# Patient Record
Sex: Male | Born: 1985 | Race: White | Hispanic: No | Marital: Married | Smoking: Former smoker
Health system: Southern US, Community
[De-identification: ages and names within clinical notes are randomized; demographics above are authoritative.]

## PROBLEM LIST (undated history)

## (undated) DIAGNOSIS — L409 Psoriasis, unspecified: Secondary | ICD-10-CM

## (undated) DIAGNOSIS — G4730 Sleep apnea, unspecified: Secondary | ICD-10-CM

## (undated) DIAGNOSIS — M51369 Other intervertebral disc degeneration, lumbar region without mention of lumbar back pain or lower extremity pain: Secondary | ICD-10-CM

## (undated) DIAGNOSIS — M5136 Other intervertebral disc degeneration, lumbar region: Secondary | ICD-10-CM

## (undated) DIAGNOSIS — M5126 Other intervertebral disc displacement, lumbar region: Secondary | ICD-10-CM

## (undated) DIAGNOSIS — F419 Anxiety disorder, unspecified: Secondary | ICD-10-CM

## (undated) DIAGNOSIS — F329 Major depressive disorder, single episode, unspecified: Secondary | ICD-10-CM

## (undated) DIAGNOSIS — F32A Depression, unspecified: Secondary | ICD-10-CM

## (undated) DIAGNOSIS — M1990 Unspecified osteoarthritis, unspecified site: Secondary | ICD-10-CM

## (undated) DIAGNOSIS — K219 Gastro-esophageal reflux disease without esophagitis: Secondary | ICD-10-CM

## (undated) HISTORY — PX: VASECTOMY: SHX75

---

## 2012-09-24 HISTORY — PX: HAND SURGERY: SHX662

## 2015-02-15 ENCOUNTER — Other Ambulatory Visit: Payer: Self-pay | Admitting: Orthopaedic Surgery

## 2015-02-15 DIAGNOSIS — M79644 Pain in right finger(s): Secondary | ICD-10-CM

## 2015-02-26 ENCOUNTER — Ambulatory Visit
Admission: RE | Admit: 2015-02-26 | Discharge: 2015-02-26 | Disposition: A | Discharge status: Home / Self Care | Payer: Non-veteran care | Source: Ambulatory Visit | Attending: Orthopaedic Surgery | Admitting: Orthopaedic Surgery

## 2015-02-26 DIAGNOSIS — M79644 Pain in right finger(s): Secondary | ICD-10-CM

## 2015-07-18 ENCOUNTER — Other Ambulatory Visit: Payer: Self-pay | Admitting: Orthopedic Surgery

## 2015-07-20 ENCOUNTER — Encounter (HOSPITAL_BASED_OUTPATIENT_CLINIC_OR_DEPARTMENT_OTHER): Payer: Self-pay | Admitting: *Deleted

## 2015-07-28 ENCOUNTER — Ambulatory Visit (HOSPITAL_BASED_OUTPATIENT_CLINIC_OR_DEPARTMENT_OTHER)
Admission: RE | Admit: 2015-07-28 | Discharge: 2015-07-28 | Disposition: A | Discharge status: Home / Self Care | Payer: No Typology Code available for payment source | Source: Ambulatory Visit | Attending: Orthopedic Surgery | Admitting: Orthopedic Surgery

## 2015-07-28 ENCOUNTER — Ambulatory Visit (HOSPITAL_BASED_OUTPATIENT_CLINIC_OR_DEPARTMENT_OTHER): Payer: No Typology Code available for payment source | Admitting: Anesthesiology

## 2015-07-28 ENCOUNTER — Encounter (HOSPITAL_BASED_OUTPATIENT_CLINIC_OR_DEPARTMENT_OTHER): Payer: Self-pay | Admitting: Orthopedic Surgery

## 2015-07-28 ENCOUNTER — Encounter (HOSPITAL_BASED_OUTPATIENT_CLINIC_OR_DEPARTMENT_OTHER)
Admission: RE | Disposition: A | Discharge status: Home / Self Care | Payer: Self-pay | Source: Ambulatory Visit | Attending: Orthopedic Surgery

## 2015-07-28 DIAGNOSIS — Z9852 Vasectomy status: Secondary | ICD-10-CM | POA: Diagnosis not present

## 2015-07-28 DIAGNOSIS — Y929 Unspecified place or not applicable: Secondary | ICD-10-CM | POA: Insufficient documentation

## 2015-07-28 DIAGNOSIS — F17220 Nicotine dependence, chewing tobacco, uncomplicated: Secondary | ICD-10-CM | POA: Diagnosis not present

## 2015-07-28 DIAGNOSIS — X58XXXA Exposure to other specified factors, initial encounter: Secondary | ICD-10-CM | POA: Diagnosis not present

## 2015-07-28 DIAGNOSIS — Y999 Unspecified external cause status: Secondary | ICD-10-CM | POA: Insufficient documentation

## 2015-07-28 DIAGNOSIS — G4730 Sleep apnea, unspecified: Secondary | ICD-10-CM | POA: Insufficient documentation

## 2015-07-28 DIAGNOSIS — L409 Psoriasis, unspecified: Secondary | ICD-10-CM | POA: Insufficient documentation

## 2015-07-28 DIAGNOSIS — Y939 Activity, unspecified: Secondary | ICD-10-CM | POA: Insufficient documentation

## 2015-07-28 DIAGNOSIS — Z880 Allergy status to penicillin: Secondary | ICD-10-CM | POA: Diagnosis not present

## 2015-07-28 DIAGNOSIS — M1990 Unspecified osteoarthritis, unspecified site: Secondary | ICD-10-CM | POA: Insufficient documentation

## 2015-07-28 DIAGNOSIS — M65841 Other synovitis and tenosynovitis, right hand: Secondary | ICD-10-CM | POA: Diagnosis not present

## 2015-07-28 DIAGNOSIS — K219 Gastro-esophageal reflux disease without esophagitis: Secondary | ICD-10-CM | POA: Diagnosis not present

## 2015-07-28 DIAGNOSIS — F329 Major depressive disorder, single episode, unspecified: Secondary | ICD-10-CM | POA: Insufficient documentation

## 2015-07-28 DIAGNOSIS — S5321XA Traumatic rupture of right radial collateral ligament, initial encounter: Secondary | ICD-10-CM | POA: Insufficient documentation

## 2015-07-28 DIAGNOSIS — S63418A Traumatic rupture of collateral ligament of other finger at metacarpophalangeal and interphalangeal joint, initial encounter: Secondary | ICD-10-CM | POA: Diagnosis present

## 2015-07-28 DIAGNOSIS — H9190 Unspecified hearing loss, unspecified ear: Secondary | ICD-10-CM | POA: Insufficient documentation

## 2015-07-28 DIAGNOSIS — Z882 Allergy status to sulfonamides status: Secondary | ICD-10-CM | POA: Diagnosis not present

## 2015-07-28 DIAGNOSIS — Z881 Allergy status to other antibiotic agents status: Secondary | ICD-10-CM | POA: Insufficient documentation

## 2015-07-28 HISTORY — DX: Psoriasis, unspecified: L40.9

## 2015-07-28 HISTORY — DX: Unspecified osteoarthritis, unspecified site: M19.90

## 2015-07-28 HISTORY — DX: Gastro-esophageal reflux disease without esophagitis: K21.9

## 2015-07-28 HISTORY — PX: ULNAR COLLATERAL LIGAMENT REPAIR: SHX6159

## 2015-07-28 HISTORY — DX: Other intervertebral disc degeneration, lumbar region: M51.36

## 2015-07-28 HISTORY — DX: Depression, unspecified: F32.A

## 2015-07-28 HISTORY — DX: Major depressive disorder, single episode, unspecified: F32.9

## 2015-07-28 HISTORY — DX: Other intervertebral disc displacement, lumbar region: M51.26

## 2015-07-28 HISTORY — DX: Sleep apnea, unspecified: G47.30

## 2015-07-28 HISTORY — PX: PERCUTANEOUS PINNING: SHX2209

## 2015-07-28 HISTORY — DX: Other intervertebral disc degeneration, lumbar region without mention of lumbar back pain or lower extremity pain: M51.369

## 2015-07-28 SURGERY — REPAIR, LIGAMENT, ULNAR COLLATERAL
Anesthesia: General | Site: Thumb | Laterality: Right

## 2015-07-28 MED ORDER — PROPOFOL 10 MG/ML IV BOLUS
INTRAVENOUS | Status: AC
  Filled 2015-07-28: qty 20

## 2015-07-28 MED ORDER — GLYCOPYRROLATE 0.2 MG/ML IJ SOLN: 0.2000 mg | Freq: Once | INTRAMUSCULAR | Status: DC | PRN

## 2015-07-28 MED ORDER — DEXAMETHASONE SODIUM PHOSPHATE 10 MG/ML IJ SOLN
INTRAMUSCULAR | Status: DC | PRN
  Administered 2015-07-28: 10 mg via INTRAVENOUS

## 2015-07-28 MED ORDER — OXYCODONE HCL 5 MG PO TABS
ORAL_TABLET | ORAL | Status: AC
  Filled 2015-07-28: qty 1

## 2015-07-28 MED ORDER — MIDAZOLAM HCL 2 MG/2ML IJ SOLN
1.0000 mg | INTRAMUSCULAR | Status: DC | PRN
  Administered 2015-07-28: 2 mg via INTRAVENOUS

## 2015-07-28 MED ORDER — LACTATED RINGERS IV SOLN
INTRAVENOUS | Status: DC
  Administered 2015-07-28: 11:00:00 via INTRAVENOUS

## 2015-07-28 MED ORDER — ONDANSETRON HCL 4 MG/2ML IJ SOLN
INTRAMUSCULAR | Status: DC | PRN
  Administered 2015-07-28: 4 mg via INTRAVENOUS

## 2015-07-28 MED ORDER — LIDOCAINE HCL (CARDIAC) 20 MG/ML IV SOLN
INTRAVENOUS | Status: AC
  Filled 2015-07-28: qty 5

## 2015-07-28 MED ORDER — FENTANYL CITRATE (PF) 100 MCG/2ML IJ SOLN
INTRAMUSCULAR | Status: AC
  Filled 2015-07-28: qty 2

## 2015-07-28 MED ORDER — CHLORHEXIDINE GLUCONATE 4 % EX LIQD: 60.0000 mL | Freq: Once | CUTANEOUS | Status: DC

## 2015-07-28 MED ORDER — VANCOMYCIN HCL IN DEXTROSE 1-5 GM/200ML-% IV SOLN
INTRAVENOUS | Status: AC
  Filled 2015-07-28: qty 200

## 2015-07-28 MED ORDER — KETOROLAC TROMETHAMINE 30 MG/ML IJ SOLN: 30.0000 mg | Freq: Once | INTRAMUSCULAR | Status: DC

## 2015-07-28 MED ORDER — BUPIVACAINE HCL (PF) 0.25 % IJ SOLN
INTRAMUSCULAR | Status: DC | PRN
  Administered 2015-07-28: 6 mL

## 2015-07-28 MED ORDER — BUPIVACAINE HCL (PF) 0.25 % IJ SOLN
INTRAMUSCULAR | Status: AC
  Filled 2015-07-28: qty 30

## 2015-07-28 MED ORDER — HYDROCODONE-ACETAMINOPHEN 10-325 MG PO TABS: 1.0000 | ORAL_TABLET | Freq: Four times a day (QID) | ORAL | Status: DC | PRN

## 2015-07-28 MED ORDER — FENTANYL CITRATE (PF) 100 MCG/2ML IJ SOLN
50.0000 ug | INTRAMUSCULAR | Status: AC | PRN
  Administered 2015-07-28 (×2): 50 ug via INTRAVENOUS
  Administered 2015-07-28: 100 ug via INTRAVENOUS

## 2015-07-28 MED ORDER — PROMETHAZINE HCL 25 MG/ML IJ SOLN: 6.2500 mg | INTRAMUSCULAR | Status: DC | PRN

## 2015-07-28 MED ORDER — DEXAMETHASONE SODIUM PHOSPHATE 10 MG/ML IJ SOLN
INTRAMUSCULAR | Status: AC
  Filled 2015-07-28: qty 1

## 2015-07-28 MED ORDER — ONDANSETRON HCL 4 MG/2ML IJ SOLN
INTRAMUSCULAR | Status: AC
  Filled 2015-07-28: qty 2

## 2015-07-28 MED ORDER — PROPOFOL 10 MG/ML IV BOLUS
INTRAVENOUS | Status: DC | PRN
  Administered 2015-07-28: 300 mg via INTRAVENOUS

## 2015-07-28 MED ORDER — FENTANYL CITRATE (PF) 100 MCG/2ML IJ SOLN
25.0000 ug | INTRAMUSCULAR | Status: DC | PRN
  Administered 2015-07-28 (×2): 50 ug via INTRAVENOUS

## 2015-07-28 MED ORDER — SCOPOLAMINE 1 MG/3DAYS TD PT72: 1.0000 | MEDICATED_PATCH | Freq: Once | TRANSDERMAL | Status: DC | PRN

## 2015-07-28 MED ORDER — FENTANYL CITRATE (PF) 100 MCG/2ML IJ SOLN
INTRAMUSCULAR | Status: AC
  Filled 2015-07-28: qty 4

## 2015-07-28 MED ORDER — SUCCINYLCHOLINE CHLORIDE 20 MG/ML IJ SOLN
INTRAMUSCULAR | Status: AC
  Filled 2015-07-28: qty 1

## 2015-07-28 MED ORDER — VANCOMYCIN HCL IN DEXTROSE 1-5 GM/200ML-% IV SOLN
1000.0000 mg | INTRAVENOUS | Status: AC
  Administered 2015-07-28: 1000 mg via INTRAVENOUS

## 2015-07-28 MED ORDER — CEFAZOLIN SODIUM-DEXTROSE 2-3 GM-% IV SOLR: 2.0000 g | INTRAVENOUS | Status: DC

## 2015-07-28 MED ORDER — OXYCODONE HCL 5 MG PO TABS
5.0000 mg | ORAL_TABLET | Freq: Once | ORAL | Status: AC
Start: 2015-07-28 — End: 2015-07-28
  Administered 2015-07-28: 5 mg via ORAL

## 2015-07-28 MED ORDER — MIDAZOLAM HCL 2 MG/2ML IJ SOLN
INTRAMUSCULAR | Status: AC
  Filled 2015-07-28: qty 4

## 2015-07-28 MED ORDER — LIDOCAINE HCL (PF) 1 % IJ SOLN
INTRAMUSCULAR | Status: AC
  Filled 2015-07-28: qty 30

## 2015-07-28 MED ORDER — LIDOCAINE HCL (CARDIAC) 20 MG/ML IV SOLN
INTRAVENOUS | Status: DC | PRN
  Administered 2015-07-28: 50 mg via INTRAVENOUS

## 2015-07-28 SURGICAL SUPPLY — 67 items
ANCHOR JUGGERKNOT 1.0 1DR 2-0 (Anchor) ×4 IMPLANT
BAG DECANTER FOR FLEXI CONT (MISCELLANEOUS) IMPLANT
BLADE MINI RND TIP GREEN BEAV (BLADE) ×4 IMPLANT
BLADE SURG 15 STRL LF DISP TIS (BLADE) ×2 IMPLANT
BLADE SURG 15 STRL SS (BLADE) ×2
BNDG COHESIVE 3X5 TAN STRL LF (GAUZE/BANDAGES/DRESSINGS) ×4 IMPLANT
BNDG ESMARK 4X9 LF (GAUZE/BANDAGES/DRESSINGS) ×4 IMPLANT
BNDG GAUZE ELAST 4 BULKY (GAUZE/BANDAGES/DRESSINGS) ×4 IMPLANT
CHLORAPREP W/TINT 26ML (MISCELLANEOUS) ×4 IMPLANT
CORDS BIPOLAR (ELECTRODE) ×4 IMPLANT
COVER BACK TABLE 60X90IN (DRAPES) ×4 IMPLANT
COVER MAYO STAND STRL (DRAPES) ×4 IMPLANT
CUFF TOURNIQUET SINGLE 18IN (TOURNIQUET CUFF) ×4 IMPLANT
DECANTER SPIKE VIAL GLASS SM (MISCELLANEOUS) ×4 IMPLANT
DRAPE EXTREMITY T 121X128X90 (DRAPE) ×4 IMPLANT
DRAPE OEC MINIVIEW 54X84 (DRAPES) ×4 IMPLANT
DRAPE SURG 17X23 STRL (DRAPES) ×4 IMPLANT
GAUZE SPONGE 4X4 12PLY STRL (GAUZE/BANDAGES/DRESSINGS) ×4 IMPLANT
GAUZE SPONGE 4X4 16PLY XRAY LF (GAUZE/BANDAGES/DRESSINGS) IMPLANT
GAUZE XEROFORM 1X8 LF (GAUZE/BANDAGES/DRESSINGS) ×4 IMPLANT
GLOVE BIOGEL PI IND STRL 8 (GLOVE) ×2 IMPLANT
GLOVE BIOGEL PI IND STRL 8.5 (GLOVE) ×2 IMPLANT
GLOVE BIOGEL PI INDICATOR 8 (GLOVE) ×2
GLOVE BIOGEL PI INDICATOR 8.5 (GLOVE) ×2
GLOVE SURG ORTHO 8.0 STRL STRW (GLOVE) ×4 IMPLANT
GOWN STRL REUS W/ TWL LRG LVL3 (GOWN DISPOSABLE) ×2 IMPLANT
GOWN STRL REUS W/TWL LRG LVL3 (GOWN DISPOSABLE) ×2
GOWN STRL REUS W/TWL XL LVL3 (GOWN DISPOSABLE) ×4 IMPLANT
K-WIRE .035X4 (WIRE) IMPLANT
NDL SAFETY ECLIPSE 18X1.5 (NEEDLE) IMPLANT
NEEDLE FISTULA 1/2 CIRCLE (NEEDLE) IMPLANT
NEEDLE HYPO 18GX1.5 SHARP (NEEDLE)
NEEDLE KEITH (NEEDLE) IMPLANT
NEEDLE KEITH SZ10 STRAIGHT (NEEDLE) IMPLANT
NEEDLE PRECISIONGLIDE 27X1.5 (NEEDLE) ×4 IMPLANT
NS IRRIG 1000ML POUR BTL (IV SOLUTION) ×4 IMPLANT
PACK BASIN DAY SURGERY FS (CUSTOM PROCEDURE TRAY) ×4 IMPLANT
PAD CAST 3X4 CTTN HI CHSV (CAST SUPPLIES) ×2 IMPLANT
PAD CAST 4YDX4 CTTN HI CHSV (CAST SUPPLIES) IMPLANT
PADDING CAST ABS 4INX4YD NS (CAST SUPPLIES) ×2
PADDING CAST ABS COTTON 4X4 ST (CAST SUPPLIES) ×2 IMPLANT
PADDING CAST COTTON 3X4 STRL (CAST SUPPLIES) ×2
PADDING CAST COTTON 4X4 STRL (CAST SUPPLIES)
PASSER SUT SWANSON 36MM LOOP (INSTRUMENTS) IMPLANT
SLEEVE SCD COMPRESS KNEE MED (MISCELLANEOUS) ×4 IMPLANT
SPLINT PLASTER CAST XFAST 3X15 (CAST SUPPLIES) IMPLANT
SPLINT PLASTER XTRA FASTSET 3X (CAST SUPPLIES)
STOCKINETTE 4X48 STRL (DRAPES) ×4 IMPLANT
SUT CHROMIC 5 0 P 3 (SUTURE) ×4 IMPLANT
SUT ETHIBOND 3-0 V-5 (SUTURE) IMPLANT
SUT ETHILON 4 0 PS 2 18 (SUTURE) ×4 IMPLANT
SUT FIBERWIRE 2-0 18 17.9 3/8 (SUTURE)
SUT FIBERWIRE 3-0 18 TAPR NDL (SUTURE)
SUT MERSILENE 2.0 SH NDLE (SUTURE) IMPLANT
SUT MERSILENE 4 0 P 3 (SUTURE) ×4 IMPLANT
SUT MERSILENE 6 0 P 1 (SUTURE) IMPLANT
SUT SILK 4 0 PS 2 (SUTURE) IMPLANT
SUT STEEL 3 0 (SUTURE) IMPLANT
SUT STEEL 4 0 (SUTURE) IMPLANT
SUT STEEL 4 0 V 26 (SUTURE) IMPLANT
SUT VICRYL 4-0 PS2 18IN ABS (SUTURE) IMPLANT
SUTURE FIBERWR 2-0 18 17.9 3/8 (SUTURE) IMPLANT
SUTURE FIBERWR 3-0 18 TAPR NDL (SUTURE) IMPLANT
SYR BULB 3OZ (MISCELLANEOUS) ×4 IMPLANT
SYR CONTROL 10ML LL (SYRINGE) ×4 IMPLANT
TOWEL OR 17X24 6PK STRL BLUE (TOWEL DISPOSABLE) ×8 IMPLANT
UNDERPAD 30X30 (UNDERPADS AND DIAPERS) ×4 IMPLANT

## 2015-07-28 NOTE — H&P (Signed)
[name] [name] is a 29 year old left hand dominant male who comes in complaining of pain in his right thumb, burning in nature. This began in December. He recalls no history of injury. He states it is not swelling. He complains of pain at the MCP joint. He has been seen by Dr. Magnus Ivan and several other physicians including the [facility]. He was placed in a splint for a period of time. He states he has had a gradual loss of mobility. He has had an MRI done revealing an FPL tendonitis. There is no history of diabetes, thyroid problems, arthritis or gout. He was given a course of Prednisone which has helped. He has also had nerve conductions done revealing bilateral carpal tunnel syndrome. He is not awakened at night. He is on Hydrocodone, Gabapentin, Wellbutrin, Methocarbamol, and anti-inflammatories. He has problems with his hips.  PAST MEDICAL HISTORY:  He is allergic to Amoxicillin and PCN. He is on no other medicines. Past surgery includes a 5th metacarpal boxer's fracture and vasectomy.   FAMILY MEDICAL HISTORY: Negative.  SOCIAL HISTORY:  He chews. He drinks socially. He is single and parts associated at [employer].  REVIEW OF SYSTEMS: Positive for glasses, hearing loss, ringing in his ears, otherwise negative 14 points.  [name] [name] is an 29 y.o. male.   Chief Complaint: instability right thumb HPI: see above  Past Medical History  Diagnosis Date  . Arthritis   . Bulging lumbar disc   . Sleep apnea   . Depression   . GERD (gastroesophageal reflux disease)   . Psoriasis     Past Surgical History  Procedure Laterality Date  . Hand surgery Right 2014    History reviewed. No pertinent family history. Social History:  reports that he has quit smoking. He uses smokeless tobacco. He reports that he drinks alcohol. He reports that he does not use illicit drugs.  Allergies:  Allergies  Allergen Reactions  . Penicillins Shortness Of Breath  . Sulfa Antibiotics Rash    No prescriptions prior  to admission    No results found for this or any previous visit (from the past 48 hour(s)).  No results found.   Pertinent items are noted in HPI.  Height  (1.753 m), weight 88.451 kg (195 lb).  General appearance: alert, cooperative and appears stated age Head: Normocephalic, without obvious abnormality Neck: no JVD Resp: clear to auscultation bilaterally Cardio: regular rate and rhythm, S1, S2 normal, no murmur, click, rub or gallop GI: soft, non-tender; bowel sounds normal; no masses,  no organomegaly Extremities:pian and instability right trhumb Pulses: 2+ and symmetric Skin: Skin color, texture, turgor normal. 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 There is no significant changes at the IP joint.   His MRI is reviewed revealing that he does show the significant subluxation to the MCP joint. He does not show similar changes on his opposite thumb.   PLAN:  He would like to proceed to attempt to have reconstruction of this. He does have a palmaris longus which we can use as a graft. Pre, peri and post op care are discussed along with risks and complications. Patient is aware there is no guarantee with surgery, possibility of infection, injury to arteries, nerves, and tendons, incomplete relief and dystrophy. He is scheduled as an outpatient under regional anesthesia with the use of juggernauts and pinning of the joints.  Kailan Carmen R 07/28/2015,  10:05 AM

## 2015-07-28 NOTE — Anesthesia Postprocedure Evaluation (Signed)
  Anesthesia Post-op Note  Patient: [name] [name]  Procedure(s) Performed: Procedure(s): RECONSTRUCTION RADIAL COLLATERAL LIGAMENT RIGHT THUMB (Right) POSSIBLE PALMARIS LONGUS GRAFT, PINNING METACARPAL JOINT  (Right)  Patient Location: PACU  Anesthesia Type: General   Level of Consciousness: awake, alert  and oriented  Airway and Oxygen Therapy: Patient Spontanous Breathing  Post-op Pain: mild  Post-op Assessment: Post-op Vital signs reviewed  Post-op Vital Signs: Reviewed  Last Vitals:  Filed Vitals:   07/28/15 1456  BP: 143/84  Pulse: 86  Temp: 36.6 C  Resp: 18    Complications: No apparent anesthesia complications

## 2015-07-28 NOTE — Transfer of Care (Signed)
Immediate Anesthesia Transfer of Care Note  Patient: [name] [name]  Procedure(s) Performed: Procedure(s): RECONSTRUCTION RADIAL COLLATERAL LIGAMENT RIGHT THUMB (Right) POSSIBLE PALMARIS LONGUS GRAFT, PINNING METACARPAL JOINT  (Right)  Patient Location: PACU  Anesthesia Type:General  Level of Consciousness: awake, alert  and oriented  Airway & Oxygen Therapy: Patient Spontanous Breathing and Patient connected to face mask oxygen  Post-op Assessment: Report given to RN and Post -op Vital signs reviewed and stable  Post vital signs: Reviewed and stable  Last Vitals:  Filed Vitals:   07/28/15 1054  BP: 135/88  Pulse: 68  Temp: 36.7 C  Resp: 18    Complications: No apparent anesthesia complications

## 2015-07-28 NOTE — Brief Op Note (Signed)
07/28/2015  1:20 PM  PATIENT:  [name] [name]  29 y.o. male  PRE-OPERATIVE DIAGNOSIS:  RUPTURE RADIAL COLLATERAL LIGAMENT RIGHT THUMB METACARPAL PHALANGEAL JOINT  POST-OPERATIVE DIAGNOSIS:  RUPTURE RADIAL COLLATERAL LIGAMENT RIGHT THUMB METACARPAL PHALANGEAL JOINT  PROCEDURE:  Procedure(s): RECONSTRUCTION RADIAL COLLATERAL LIGAMENT RIGHT THUMB (Right) POSSIBLE PALMARIS LONGUS GRAFT, PINNING METACARPAL JOINT  (Right)  SURGEON:  Surgeon(s) and Role:    * Cindee SaltGary Tareva Leske, MD - Primary    * Betha LoaKevin Zylah Elsbernd, MD - Assisting  PHYSICIAN ASSISTANT:   ASSISTANTS: K Alyxander Kollmann,MD   ANESTHESIA:   local and general  EBL:  Total I/O In: 1200 [I.V.:1200] Out: -   BLOOD ADMINISTERED:none  DRAINS: none   LOCAL MEDICATIONS USED:  BUPIVICAINE   SPECIMEN:  Excision  DISPOSITION OF SPECIMEN:  PATHOLOGY  COUNTS:  YES  TOURNIQUET:   Total Tourniquet Time Documented: Upper Arm (Right) - 34 minutes Total: Upper Arm (Right) - 34 minutes   DICTATION: .Other Dictation: Dictation Number [id number]  PLAN OF CARE: Discharge to home after PACU  PATIENT DISPOSITION:  PACU - hemodynamically stable.

## 2015-07-28 NOTE — Discharge Instructions (Addendum)

## 2015-07-28 NOTE — Anesthesia Preprocedure Evaluation (Signed)
Anesthesia Evaluation  Patient identified by MRN, date of birth, ID band Patient awake    Reviewed: Allergy & Precautions, NPO status , Patient's Chart, lab work & pertinent test results  Airway Mallampati: II  TM Distance: >3 FB Neck ROM: Full    Dental no notable dental hx.    Pulmonary sleep apnea , former smoker,    Pulmonary exam normal breath sounds clear to auscultation       Cardiovascular negative cardio ROS Normal cardiovascular exam Rhythm:Regular Rate:Normal     Neuro/Psych negative neurological ROS  negative psych ROS   GI/Hepatic negative GI ROS, Neg liver ROS,   Endo/Other  negative endocrine ROS  Renal/GU negative Renal ROS  negative genitourinary   Musculoskeletal negative musculoskeletal ROS (+)   Abdominal   Peds negative pediatric ROS (+)  Hematology negative hematology ROS (+)   Anesthesia Other Findings   Reproductive/Obstetrics negative OB ROS                             Anesthesia Physical Anesthesia Plan  ASA: II  Anesthesia Plan: General   Post-op Pain Management:    Induction: Intravenous  Airway Management Planned: LMA  Additional Equipment:   Intra-op Plan:   Post-operative Plan: Extubation in OR  Informed Consent: I have reviewed the patients History and Physical, chart, labs and discussed the procedure including the risks, benefits and alternatives for the proposed anesthesia with the patient or authorized representative who has indicated his/her understanding and acceptance.   Dental advisory given  Plan Discussed with: CRNA and Surgeon  Anesthesia Plan Comments:         Anesthesia Quick Evaluation

## 2015-07-28 NOTE — Anesthesia Procedure Notes (Signed)
Procedure Name: LMA Insertion Date/Time: 07/28/2015 12:23 PM Performed by: Zenia ResidesPAYNE, Joei Frangos D Pre-anesthesia Checklist: Patient identified, Emergency Drugs available, Suction available and Patient being monitored Patient Re-evaluated:Patient Re-evaluated prior to inductionOxygen Delivery Method: Circle System Utilized Preoxygenation: Pre-oxygenation with 100% oxygen Intubation Type: IV induction Ventilation: Mask ventilation without difficulty LMA: LMA inserted LMA Size: 5.0 Number of attempts: 1 Airway Equipment and Method: Bite block Placement Confirmation: positive ETCO2 Tube secured with: Tape Dental Injury: Teeth and Oropharynx as per pre-operative assessment

## 2015-07-28 NOTE — Op Note (Signed)
Dictation Number [id number] Intra-operative fluoroscopic images in the AP, lateral, and oblique views were taken and evaluated by myself.  Reduction of the joint and K-wire placementwere confirmed.

## 2015-07-29 ENCOUNTER — Encounter (HOSPITAL_BASED_OUTPATIENT_CLINIC_OR_DEPARTMENT_OTHER): Payer: Self-pay | Admitting: Orthopedic Surgery

## 2015-07-29 NOTE — Op Note (Signed)
NAMDevota [name]:  [name], [name] [name]               ACCOUNT NO.:  [id number]  MEDICAL RECORD NO.:  [id number]  LOCATION:                                 FACILITY:  PHYSICIAN:  [name] [name], M.D.            DATE OF BIRTH:  DATE OF PROCEDURE:  07/28/2015 DATE OF DISCHARGE:                              OPERATIVE REPORT   PREOPERATIVE DIAGNOSIS:  Ruptured radial collateral ligament in metacarpophalangeal joint, right thumb.  POSTOPERATIVE DIAGNOSIS:  Ruptured radial collateral ligament in metacarpophalangeal joint, right thumb.  OPERATION:  Repair of radial collateral ligament in metacarpophalangeal joint, right thumb with synovial biopsy of metacarpophalangeal joints.  SURGEON:  [name] SaltGary Xolani Degracia, MD  ASSISTANT:  [name] LoaKevin Aveion Nguyen, MD  ANESTHESIA:  General with local infiltration.  ANESTHESIOLOGIST:  [name] [name], M.D.  HISTORY:  The patient is a 29 year old male with a history of pain, swelling, instability of the metacarpophalangeal joint of his right thumb.  MRI reveals intact collateral ligaments.  X-rays revealed that he has significant rotatory deformity and ulnar deviation of the metacarpophalangeal joint indicative of an old rupture to the collateral ligament and incompetency.  He has elected to undergo surgical repair of this, perhaps fusion of the joint.  Pre, peri, and postoperative course have been discussed along with risks and complications.  He is aware that there is no guarantee with the surgery, possibility of infection; recurrence of injury to arteries, nerves, tendons; incomplete relief of symptoms; dystrophy in preoperative area the patient is seen.  The extremity marked by both patient and surgeon.  Antibiotic given.  PROCEDURE IN DETAIL:  The patient was brought to the operating room, where general anesthetic was carried out without difficulty.  He was prepped using ChloraPrep in supine position with the right arm free.  A 3-minute dry time was allowed.  Time-out taken, confirming the  patient and procedure.  The limb was exsanguinated with an Esmarch bandage. Tourniquet was placed high on the arm, was inflated to 250 mmHg.  A curvilinear incision was made over the metacarpophalangeal joint of the right thumb, apex radially, carried down through subcutaneous tissue. Dorsal sensory nerve was identified, protected.  Dissection was carried down through the extensor hood dorsal to the collateral ligament attachment of the joint opened.  The joint space was extremely small.  A synovectomy was performed and the specimen sent to Pathology.  The distal attachment of the collateral ligament was significantly stretched.  This was resected distally, scar removed.  The thumb was then able to be rotated, brought into a straight position and pinned with a 0.035 K-wire.  Wound was irrigated.  A juggernaut was then selected for repair, the whole guide was made.  The juggernaut inserted on the volar radial aspect of the proximal phalanx.  This was then used to repair the distal portion of collateral ligament back down the bone. The remainder of the capsule was then imbricated over this with figure- of-eight 4-0 Mersilene sutures.  The extensor hood repaired with a running 5-0 Mersilene and the skin with interrupted 5-0 nylon sutures. X-rays confirmed positioning of the metacarpal phalangeal joint in good position with no rotatory deformity.  The pin was  bent and cut short.  A sterile compressive dressing, splint, thumb spica dorsal palmar applied after local infiltration with 0.25% bupivacaine without epinephrine was given.  On deflation of the tourniquet, all fingers immediately pinked. He was taken to the recovery room for observation in satisfactory condition.  He will be discharged home to return to the [facility] in 1 week on [facility].          ______________________________ [name] Salt, M.D.     GK/MEDQ  D:  07/28/2015  T:  07/29/2015  Job:  [id number]

## 2015-08-03 ENCOUNTER — Encounter (HOSPITAL_BASED_OUTPATIENT_CLINIC_OR_DEPARTMENT_OTHER): Payer: Self-pay | Admitting: Orthopedic Surgery

## 2015-09-14 ENCOUNTER — Emergency Department (HOSPITAL_COMMUNITY): Payer: Non-veteran care

## 2015-09-14 ENCOUNTER — Emergency Department (HOSPITAL_COMMUNITY)
Admission: EM | Admit: 2015-09-14 | Discharge: 2015-09-14 | Disposition: A | Discharge status: Home / Self Care | Payer: Non-veteran care | Attending: Emergency Medicine | Admitting: Emergency Medicine

## 2015-09-14 ENCOUNTER — Encounter (HOSPITAL_COMMUNITY): Payer: Self-pay | Admitting: *Deleted

## 2015-09-14 DIAGNOSIS — Z872 Personal history of diseases of the skin and subcutaneous tissue: Secondary | ICD-10-CM | POA: Diagnosis not present

## 2015-09-14 DIAGNOSIS — R079 Chest pain, unspecified: Secondary | ICD-10-CM

## 2015-09-14 DIAGNOSIS — Z79899 Other long term (current) drug therapy: Secondary | ICD-10-CM | POA: Diagnosis not present

## 2015-09-14 DIAGNOSIS — Z880 Allergy status to penicillin: Secondary | ICD-10-CM | POA: Insufficient documentation

## 2015-09-14 DIAGNOSIS — Z8669 Personal history of other diseases of the nervous system and sense organs: Secondary | ICD-10-CM | POA: Insufficient documentation

## 2015-09-14 DIAGNOSIS — M1990 Unspecified osteoarthritis, unspecified site: Secondary | ICD-10-CM | POA: Diagnosis not present

## 2015-09-14 DIAGNOSIS — Z87891 Personal history of nicotine dependence: Secondary | ICD-10-CM | POA: Insufficient documentation

## 2015-09-14 DIAGNOSIS — M542 Cervicalgia: Secondary | ICD-10-CM | POA: Insufficient documentation

## 2015-09-14 DIAGNOSIS — Z8719 Personal history of other diseases of the digestive system: Secondary | ICD-10-CM | POA: Diagnosis not present

## 2015-09-14 DIAGNOSIS — R0602 Shortness of breath: Secondary | ICD-10-CM | POA: Diagnosis not present

## 2015-09-14 DIAGNOSIS — F329 Major depressive disorder, single episode, unspecified: Secondary | ICD-10-CM | POA: Diagnosis not present

## 2015-09-14 LAB — BASIC METABOLIC PANEL
Anion gap: 9 (ref 5–15)
BUN: 15 mg/dL (ref 6–20)
CO2: 25 mmol/L (ref 22–32)
CREATININE: 1.11 mg/dL (ref 0.61–1.24)
Calcium: 9.7 mg/dL (ref 8.9–10.3)
Chloride: 105 mmol/L (ref 101–111)
GFR calc Af Amer: >60 mL/min (ref >60)
Glucose, Bld: 97 mg/dL (ref 65–99)
Potassium: 3.9 mmol/L (ref 3.5–5.1)
SODIUM: 139 mmol/L (ref 135–145)

## 2015-09-14 LAB — CBC
HCT: 44.4 % (ref 39.0–52.0)
Hemoglobin: 16.3 g/dL (ref 13.0–17.0)
MCH: 32.3 pg (ref 26.0–34.0)
MCHC: 36.7 g/dL — AB (ref 30.0–36.0)
MCV: 88.1 fL (ref 78.0–100.0)
PLATELETS: 196 10*3/uL (ref 150–400)
RBC: 5.04 MIL/uL (ref 4.22–5.81)
RDW: 12.6 % (ref 11.5–15.5)
WBC: 5 10*3/uL (ref 4.0–10.5)

## 2015-09-14 LAB — TROPONIN I: Troponin I: <0.03 ng/mL (ref <0.031)

## 2015-09-14 LAB — I-STAT TROPONIN, ED: TROPONIN I, POC: 0 ng/mL (ref 0.00–0.08)

## 2015-09-14 MED ORDER — ASPIRIN 81 MG PO CHEW: 324.0000 mg | CHEWABLE_TABLET | Freq: Once | ORAL | Status: DC

## 2015-09-14 MED ORDER — OMEPRAZOLE 20 MG PO CPDR: 20.0000 mg | DELAYED_RELEASE_CAPSULE | Freq: Two times a day (BID) | ORAL | Status: DC

## 2015-09-14 MED ORDER — GI COCKTAIL ~~LOC~~
30.0000 mL | Freq: Once | ORAL | Status: AC
  Administered 2015-09-14: 30 mL via ORAL
  Filled 2015-09-14: qty 30

## 2015-09-14 NOTE — ED Notes (Signed)
EKG given to MD

## 2015-09-14 NOTE — ED Notes (Addendum)
Pt complains of chest pain, neck pain, diaphoresis and shortness of breath since 430PM today. Pt states he was sitting at a computer at work when the pain started. Pt states he felt nausea initially, which has subsided. Pt states he has had similar episodes earlier in the month but did not get evaluated at the time.

## 2015-09-14 NOTE — Discharge Instructions (Signed)
Nonspecific Chest Pain  °Chest pain can be caused by many different conditions. There is always a chance that your pain could be related to something serious, such as a heart attack or a blood clot in your lungs. Chest pain can also be caused by conditions that are not life-threatening. If you have chest pain, it is very important to follow up with your health care provider. °CAUSES  °Chest pain can be caused by: °· Heartburn. °· Pneumonia or bronchitis. °· Anxiety or stress. °· Inflammation around your heart (pericarditis) or lung (pleuritis or pleurisy). °· A blood clot in your lung. °· A collapsed lung (pneumothorax). It can develop suddenly on its own (spontaneous pneumothorax) or from trauma to the chest. °· Shingles infection (varicella-zoster virus). °· Heart attack. °· Damage to the bones, muscles, and cartilage that make up your chest wall. This can include: °¨ Bruised bones due to injury. °¨ Strained muscles or cartilage due to frequent or repeated coughing or overwork. °¨ Fracture to one or more ribs. °¨ Sore cartilage due to inflammation (costochondritis). °RISK FACTORS  °Risk factors for chest pain may include: °· Activities that increase your risk for trauma or injury to your chest. °· Respiratory infections or conditions that cause frequent coughing. °· Medical conditions or overeating that can cause heartburn. °· Heart disease or family history of heart disease. °· Conditions or health behaviors that increase your risk of developing a blood clot. °· Having had chicken pox (varicella zoster). °SIGNS AND SYMPTOMS °Chest pain can feel like: °· Burning or tingling on the surface of your chest or deep in your chest. °· Crushing, pressure, aching, or squeezing pain. °· Dull or sharp pain that is worse when you move, cough, or take a deep breath. °· Pain that is also felt in your back, neck, shoulder, or arm, or pain that spreads to any of these areas. °Your chest pain may come and go, or it may stay  constant. °DIAGNOSIS °Lab tests or other studies may be needed to find the cause of your pain. Your health care provider may have you take a test called an ambulatory ECG (electrocardiogram). An ECG records your heartbeat patterns at the time the test is performed. You may also have other tests, such as: °· Transthoracic echocardiogram (TTE). During echocardiography, sound waves are used to create a picture of all of the heart structures and to look at how blood flows through your heart. °· Transesophageal echocardiogram (TEE). This is a more advanced imaging test that obtains images from inside your body. It allows your health care provider to see your heart in finer detail. °· Cardiac monitoring. This allows your health care provider to monitor your heart rate and rhythm in real time. °· Holter monitor. This is a portable device that records your heartbeat and can help to diagnose abnormal heartbeats. It allows your health care provider to track your heart activity for several days, if needed. °· Stress tests. These can be done through exercise or by taking medicine that makes your heart beat more quickly. °· Blood tests. °· Imaging tests. °TREATMENT  °Your treatment depends on what is causing your chest pain. Treatment may include: °· Medicines. These may include: °¨ Acid blockers for heartburn. °¨ Anti-inflammatory medicine. °¨ Pain medicine for inflammatory conditions. °¨ Antibiotic medicine, if an infection is present. °¨ Medicines to dissolve blood clots. °¨ Medicines to treat coronary artery disease. °· Supportive care for conditions that do not require medicines. This may include: °¨ Resting. °¨ Applying heat   or cold packs to injured areas. °¨ Limiting activities until pain decreases. °HOME CARE INSTRUCTIONS °· If you were prescribed an antibiotic medicine, finish it all even if you start to feel better. °· Avoid any activities that bring on chest pain. °· Do not use any tobacco products, including  cigarettes, chewing tobacco, or electronic cigarettes. If you need help quitting, ask your health care provider. °· Do not drink alcohol. °· Take medicines only as directed by your health care provider. °· Keep all follow-up visits as directed by your health care provider. This is important. This includes any further testing if your chest pain does not go away. °· If heartburn is the cause for your chest pain, you may be told to keep your head raised (elevated) while sleeping. This reduces the chance that acid will go from your stomach into your esophagus. °· Make lifestyle changes as directed by your health care provider. These may include: °¨ Getting regular exercise. Ask your health care provider to suggest some activities that are safe for you. °¨ Eating a heart-healthy diet. A registered dietitian can help you to learn healthy eating options. °¨ Maintaining a healthy weight. °¨ Managing diabetes, if necessary. °¨ Reducing stress. °SEEK MEDICAL CARE IF: °· Your chest pain does not go away after treatment. °· You have a rash with blisters on your chest. °· You have a fever. °SEEK IMMEDIATE MEDICAL CARE IF:  °· Your chest pain is worse. °· You have an increasing cough, or you cough up blood. °· You have severe abdominal pain. °· You have severe weakness. °· You faint. °· You have chills. °· You have sudden, unexplained chest discomfort. °· You have sudden, unexplained discomfort in your arms, back, neck, or jaw. °· You have shortness of breath at any time. °· You suddenly start to sweat, or your skin gets clammy. °· You feel nauseous or you vomit. °· You suddenly feel light-headed or dizzy. °· Your heart begins to beat quickly, or it feels like it is skipping beats. °These symptoms may represent a serious problem that is an emergency. Do not wait to see if the symptoms will go away. Get medical help right away. Call your local emergency services (911 in the [place] Do not drive yourself to the hospital. °  °This  information is not intended to replace advice given to you by your health care provider. Make sure you discuss any questions you have with your health care provider. °  °Document Released: 06/20/2005 Document Revised: 10/01/2014 Document Reviewed: 04/16/2014 °[employer] Interactive Patient Education ©2016 [employer] ° °

## 2015-09-16 NOTE — ED Provider Notes (Addendum)
CSN: [id number]     Arrival date & time 09/14/15  1805 History   [id number] MD Initiated Contact with Patient 09/14/15 2000     Chief Complaint  Patient presents with  . Chest Pain  . Shortness of Breath  . Neck Pain      HPI Patient reports developing mild anterior chest discomfort with some radiation to the left neck and associated shortness of breath.  His never had symptoms like this before.  His symptoms have persisted and been present since 4:30 PM today.  He was at rest when the symptoms began.  He reports no nausea or vomiting this time.  Several weeks ago he had similar symptoms but they were more transient at that time and he did not seek evaluation.  He reports no significant shortness of breath with exertion.  No prior history of cardiac disease.  No family history of early cardiac disease.  He denies a history of hypertension or hypercholesterolemia.  No history of diabetes.  He previously smoked cigarettes but no longer uses.   Past Medical History  Diagnosis Date  . Arthritis   . Bulging lumbar disc   . Depression   . GERD (gastroesophageal reflux disease)   . Psoriasis   . Sleep apnea     does not use    Past Surgical History  Procedure Laterality Date  . Hand surgery Right 2014  . Ulnar collateral ligament repair Right 07/28/2015    Procedure: RECONSTRUCTION RADIAL COLLATERAL LIGAMENT RIGHT THUMB;  Surgeon: Cindee SaltGary Kuzma, MD;  Location: [facility];  Service: Orthopedics;  Laterality: Right;  . Percutaneous pinning Right 07/28/2015    Procedure: PALMARIS LONGUS GRAFT, PINNING METACARPAL JOINT ;  Surgeon: Cindee SaltGary Kuzma, MD;  Location: [facility];  Service: Orthopedics;  Laterality: Right;   No family history on file. Social History  Substance Use Topics  . Smoking status: Former Games developermoker  . Smokeless tobacco: Current User    Types: Chew  . Alcohol Use: Yes     Comment: occas    Review of Systems  All other systems reviewed and are  negative.     Allergies  Penicillins; Amoxicillin; and Sulfa antibiotics  Home Medications   Prior to Admission medications   Medication Sig Start Date End Date Taking? Authorizing Provider  gabapentin (NEURONTIN) 300 MG capsule Take 900 mg by mouth 3 (three) times daily.   Yes Historical Provider, MD  HYDROcodone-acetaminophen (NORCO) 10-325 MG tablet Take 1 tablet by mouth every 6 (six) hours as needed. 07/28/15  Yes Cindee SaltGary Kuzma, MD  methocarbamol (ROBAXIN) 500 MG tablet Take 1,000 mg by mouth every 6 (six) hours as needed for muscle spasms.   Yes Historical Provider, MD  clobetasol cream (TEMOVATE) 0.05 % Apply 1 application topically 2 (two) times daily.    Historical Provider, MD  omeprazole (PRILOSEC) 20 MG capsule Take 1 capsule (20 mg total) by mouth 2 (two) times daily before a meal. 09/14/15   Azalia BilisKevin Shaniqwa Horsman, MD   BP 122/75 mmHg  Pulse 65  Temp(Src) 98.1 F (36.7 C) (Oral)  Resp 15  SpO2 98% Physical Exam  Constitutional: He is oriented to person, place, and time. He appears well-developed and well-nourished.  HENT:  Head: Normocephalic and atraumatic.  Eyes: EOM are normal.  Neck: Normal range of motion.  Cardiovascular: Normal rate, regular rhythm, normal heart sounds and intact distal pulses.   Pulmonary/Chest: Effort normal and breath sounds normal. No respiratory distress.  Abdominal: Soft. He exhibits no  distension. There is no tenderness.  Musculoskeletal: Normal range of motion.  Neurological: He is alert and oriented to person, place, and time.  Skin: Skin is warm and dry.  Psychiatric: He has a normal mood and affect. Judgment normal.  Nursing note and vitals reviewed.   ED Course  Procedures (including critical care time) Labs Review Labs Reviewed  CBC - Abnormal; Notable for the following:    MCHC 36.7 (*)    All other components within normal limits  BASIC METABOLIC PANEL  TROPONIN I  Rosezena Sensor, ED    Imaging Review Dg Chest 2  View  09/14/2015  CLINICAL DATA:  29 year old male with chest pain and shortness of breath EXAM: CHEST  2 VIEW COMPARISON:  None. FINDINGS: The heart size and mediastinal contours are within normal limits. Both lungs are clear. The visualized skeletal structures are unremarkable. IMPRESSION: No active cardiopulmonary disease. Electronically Signed   By: Elgie Collard M.D.   On: 09/14/2015 18:51   I have personally reviewed and evaluated these images and lab results as part of my medical decision-making.   EKG Interpretation   Date/Time:  Wednesday September 14 2015 21:18:06 EST Ventricular Rate:  67 PR Interval:  161 QRS Duration: 90 QT Interval:  380 QTC Calculation: 401 R Axis:   81 Text Interpretation:  Sinus arrhythmia Probable anteroseptal infarct, old  Repol abnrm suggests ischemia, inferior leads nonspecific T wave changes  suspect reversal of leads V1 and V2 compared to prior ecg given morphology  Confirmed by Kaevion Sinclair  MD, Caryn Bee ([id number]) on 09/14/2015 9:24:41 PM      MDM   Final diagnoses:  Chest pain, unspecified chest pain type    EKG without ischemic changes.  Troponin 2 is negative.  He's feeling better this time.  Discharge home in good condition.  Primary care follow-up.    Azalia Bilis, MD 09/16/15 6045  Azalia Bilis, MD 10/17/15 [phone]

## 2017-02-23 ENCOUNTER — Other Ambulatory Visit: Payer: Self-pay

## 2017-02-23 ENCOUNTER — Emergency Department (HOSPITAL_COMMUNITY)
Admission: EM | Admit: 2017-02-23 | Discharge: 2017-02-24 | Disposition: A | Discharge status: Home / Self Care | Payer: Non-veteran care | Attending: Emergency Medicine | Admitting: Emergency Medicine

## 2017-02-23 ENCOUNTER — Encounter (HOSPITAL_COMMUNITY): Payer: Self-pay

## 2017-02-23 ENCOUNTER — Emergency Department (HOSPITAL_COMMUNITY): Payer: Non-veteran care

## 2017-02-23 DIAGNOSIS — R079 Chest pain, unspecified: Secondary | ICD-10-CM | POA: Insufficient documentation

## 2017-02-23 DIAGNOSIS — R55 Syncope and collapse: Secondary | ICD-10-CM | POA: Insufficient documentation

## 2017-02-23 DIAGNOSIS — Z87891 Personal history of nicotine dependence: Secondary | ICD-10-CM | POA: Diagnosis not present

## 2017-02-23 DIAGNOSIS — R42 Dizziness and giddiness: Secondary | ICD-10-CM

## 2017-02-23 LAB — CBC
HCT: 43.8 % (ref 39.0–52.0)
HEMOGLOBIN: 15.8 g/dL (ref 13.0–17.0)
MCH: 31.4 pg (ref 26.0–34.0)
MCHC: 36.1 g/dL — ABNORMAL HIGH (ref 30.0–36.0)
MCV: 87.1 fL (ref 78.0–100.0)
Platelets: 196 10*3/uL (ref 150–400)
RBC: 5.03 MIL/uL (ref 4.22–5.81)
RDW: 13.2 % (ref 11.5–15.5)
WBC: 5.8 10*3/uL (ref 4.0–10.5)

## 2017-02-23 LAB — BASIC METABOLIC PANEL
ANION GAP: 8 (ref 5–15)
BUN: 12 mg/dL (ref 6–20)
CALCIUM: 9.6 mg/dL (ref 8.9–10.3)
CO2: 24 mmol/L (ref 22–32)
Chloride: 106 mmol/L (ref 101–111)
Creatinine, Ser: 1.13 mg/dL (ref 0.61–1.24)
GFR calc Af Amer: >60 mL/min (ref >60)
Glucose, Bld: 103 mg/dL — ABNORMAL HIGH (ref 65–99)
Potassium: 3.6 mmol/L (ref 3.5–5.1)
Sodium: 138 mmol/L (ref 135–145)

## 2017-02-23 LAB — URINALYSIS, ROUTINE W REFLEX MICROSCOPIC
Bilirubin Urine: NEGATIVE
Glucose, UA: NEGATIVE mg/dL
HGB URINE DIPSTICK: NEGATIVE
Ketones, ur: NEGATIVE mg/dL
LEUKOCYTES UA: NEGATIVE
NITRITE: NEGATIVE
PROTEIN: NEGATIVE mg/dL
Specific Gravity, Urine: 1.009 (ref 1.005–1.030)
pH: 7 (ref 5.0–8.0)

## 2017-02-23 LAB — I-STAT TROPONIN, ED: TROPONIN I, POC: 0.01 ng/mL (ref 0.00–0.08)

## 2017-02-23 LAB — CBG MONITORING, ED: GLUCOSE-CAPILLARY: 101 mg/dL — AB (ref 65–99)

## 2017-02-23 NOTE — ED Triage Notes (Addendum)
Pt endorses dizziness that began 45 mins pta. Pt noted to be very unsteady on his feet in the waiting room and assisted to a wheelchair. Pt states "my brain feels like mush and I keep almost passing out and it feels like a heavy plate is sitting on my chest" No neuro deficits. VSS. LSN 2015

## 2017-02-24 ENCOUNTER — Emergency Department (HOSPITAL_COMMUNITY): Payer: Non-veteran care

## 2017-02-24 LAB — I-STAT TROPONIN, ED: TROPONIN I, POC: 0 ng/mL (ref 0.00–0.08)

## 2017-02-24 MED ORDER — METOCLOPRAMIDE HCL 5 MG/ML IJ SOLN
10.0000 mg | Freq: Once | INTRAMUSCULAR | Status: AC
  Administered 2017-02-24: 10 mg via INTRAVENOUS
  Filled 2017-02-24: qty 2

## 2017-02-24 MED ORDER — IOPAMIDOL (ISOVUE-370) INJECTION 76%
INTRAVENOUS | Status: AC
  Filled 2017-02-24: qty 50

## 2017-02-24 MED ORDER — MECLIZINE HCL 25 MG PO TABS
25.0000 mg | ORAL_TABLET | Freq: Once | ORAL | Status: AC
  Administered 2017-02-24: 25 mg via ORAL
  Filled 2017-02-24: qty 1

## 2017-02-24 MED ORDER — IOPAMIDOL (ISOVUE-370) INJECTION 76%
50.0000 mL | Freq: Once | INTRAVENOUS | Status: AC | PRN
  Administered 2017-02-24: 50 mL via INTRAVENOUS

## 2017-02-24 MED ORDER — MECLIZINE HCL 25 MG PO TABS: 25.0000 mg | ORAL_TABLET | Freq: Two times a day (BID) | ORAL | 0 refills | Status: DC

## 2017-02-24 MED ORDER — SODIUM CHLORIDE 0.9 % IV BOLUS (SEPSIS)
1000.0000 mL | Freq: Once | INTRAVENOUS | Status: AC
  Administered 2017-02-24: 1000 mL via INTRAVENOUS

## 2017-02-24 MED ORDER — DIPHENHYDRAMINE HCL 50 MG/ML IJ SOLN
12.5000 mg | Freq: Once | INTRAMUSCULAR | Status: AC
  Administered 2017-02-24: 12.5 mg via INTRAVENOUS
  Filled 2017-02-24: qty 1

## 2017-02-24 NOTE — ED Provider Notes (Signed)
Patient feels improved after meds.  Imaging is negative.  DC to home with meclizine and close follow-up.  Return precautions given.   Roxy HorsemanBrowning, Tamira Ryland, PA-C 02/24/17 0255    Charlynne PanderYao, David Hsienta, MD 02/24/17 [phone]

## 2017-02-24 NOTE — ED Notes (Signed)
Patient transported to CT 

## 2017-02-24 NOTE — ED Notes (Signed)
Repeat istat troponin not collected..  nurse will draw when she start IV.

## 2017-02-24 NOTE — ED Provider Notes (Signed)
[facility]-EMERGENCY DEPT Provider Note   CSN: [id number] Arrival date & time: 02/23/17  2138     History   Chief Complaint Chief Complaint  Patient presents with  . Near Syncope  . Chest Pain    HPI [name] [name] is a 31 y.o. male.  HPI  Patient presents with four-hour history of "feeling like I am going to fall out" and associated chest pressure that began prior to arrival. He states that while he was driving he all of a sudden felt lightheaded and thought he was going to pass out. This has happened intermittently since it began. He denies any prior history of these symptoms. He denies chest pain but states that it is "just feels like something sitting on my chest." Denies any prior cardiac history. Also reports some blurry vision and feeling of his legs being fatigued. Denies shortness of breath, head injury, loss of consciousness, recent surgery, recent illness, fever, vomiting, diarrhea, abdominal pain, urinary symptoms, medication changes, family history of sudden cardiac death, trouble walking, numbness or weakness.  Past Medical History:  Diagnosis Date  . Arthritis   . Bulging lumbar disc   . Depression   . GERD (gastroesophageal reflux disease)   . Psoriasis   . Sleep apnea    does not use     There are no active problems to display for this patient.   Past Surgical History:  Procedure Laterality Date  . HAND SURGERY Right 2014  . PERCUTANEOUS PINNING Right 07/28/2015   Procedure: PALMARIS LONGUS GRAFT, PINNING METACARPAL JOINT ;  Surgeon: Cindee Salt, MD;  Location: [facility];  Service: Orthopedics;  Laterality: Right;  . ULNAR COLLATERAL LIGAMENT REPAIR Right 07/28/2015   Procedure: RECONSTRUCTION RADIAL COLLATERAL LIGAMENT RIGHT THUMB;  Surgeon: Cindee Salt, MD;  Location: [facility];  Service: Orthopedics;  Laterality: Right;       Home Medications    Prior to Admission medications   Medication Sig Start Date End Date Taking?  Authorizing Provider  HYDROcodone-acetaminophen (NORCO) 10-325 MG tablet Take 1 tablet by mouth every 6 (six) hours as needed. Patient not taking: Reported on 02/24/2017 07/28/15   Cindee Salt, MD  omeprazole (PRILOSEC) 20 MG capsule Take 1 capsule (20 mg total) by mouth 2 (two) times daily before a meal. Patient not taking: Reported on 02/24/2017 09/14/15   Azalia Bilis, MD    Family History History reviewed. No pertinent family history.  Social History Social History  Substance Use Topics  . Smoking status: Former Games developer  . Smokeless tobacco: Current User    Types: Chew  . Alcohol use Yes     Comment: occas     Allergies   Penicillins; Amoxicillin; and Sulfa antibiotics   Review of Systems Review of Systems  Constitutional: Negative for appetite change, chills and fever.  HENT: Negative for ear pain, rhinorrhea, sneezing and sore throat.   Eyes: Negative for photophobia and visual disturbance.  Respiratory: Negative for cough, chest tightness, shortness of breath and wheezing.   Cardiovascular: Positive for chest pain. Negative for palpitations.  Gastrointestinal: Negative for abdominal pain, blood in stool, constipation, diarrhea, nausea and vomiting.  Genitourinary: Negative for dysuria, hematuria and urgency.  Musculoskeletal: Positive for myalgias.  Skin: Negative for rash.  Neurological: Positive for light-headedness. Negative for dizziness, syncope, facial asymmetry, speech difficulty, weakness and numbness.     Physical Exam Updated Vital Signs BP (!) 137/96   Pulse 87   Temp 98.7 F (37.1 C) (Oral)   Resp  16   Ht 5\' 9"  (1.753 m)   Wt 93 kg (205 lb)   SpO2 97%   BMI 30.27 kg/m   Physical Exam  Constitutional: He is oriented to person, place, and time. He appears well-developed and well-nourished. No distress.  HENT:  Head: Normocephalic and atraumatic.  Nose: Nose normal.  Eyes: Conjunctivae and EOM are normal. Pupils are equal, round, and reactive to  light. Right eye exhibits no discharge. Left eye exhibits no discharge. No scleral icterus.  Horizontal nystagmus present  Neck: Normal range of motion. Neck supple.  Cardiovascular: Normal rate, regular rhythm, normal heart sounds and intact distal pulses.  Exam reveals no gallop and no friction rub.   No murmur heard. Pulmonary/Chest: Effort normal and breath sounds normal. No respiratory distress.  Abdominal: Soft. Bowel sounds are normal. He exhibits no distension. There is no tenderness. There is no guarding.  Musculoskeletal: Normal range of motion. He exhibits no edema.  Neurological: He is alert and oriented to person, place, and time. No cranial nerve deficit or sensory deficit. He exhibits normal muscle tone. Coordination normal.  Skin: Skin is warm and dry. No rash noted.  Psychiatric: He has a normal mood and affect.  Nursing note and vitals reviewed.    ED Treatments / Results  Labs (all labs ordered are listed, but only abnormal results are displayed) Labs Reviewed  BASIC METABOLIC PANEL - Abnormal; Notable for the following:       Result Value   Glucose, Bld 103 (*)    All other components within normal limits  CBC - Abnormal; Notable for the following:    MCHC 36.1 (*)    All other components within normal limits  URINALYSIS, ROUTINE W REFLEX MICROSCOPIC - Abnormal; Notable for the following:    Color, Urine STRAW (*)    APPearance HAZY (*)    All other components within normal limits  CBG MONITORING, ED - Abnormal; Notable for the following:    Glucose-Capillary 101 (*)    All other components within normal limits  I-STAT TROPOININ, ED    EKG  EKG Interpretation None       Radiology Dg Chest 2 View  Result Date: 02/23/2017 CLINICAL DATA:  Heavy feeling on chest EXAM: CHEST  2 VIEW COMPARISON:  09/14/2015 FINDINGS: Linear atelectasis or scar at the left base. No focal consolidation or effusion. Normal heart size. No pneumothorax. IMPRESSION: Linear  atelectasis or scarring at the left base. Electronically Signed   By: Jasmine PangKim  Fujinaga M.D.   On: 02/23/2017 22:26    Procedures Procedures (including critical care time)  Medications Ordered in ED Medications - No data to display   Initial Impression / Assessment and Plan / ED Course  I have reviewed the triage vital signs and the nursing notes.  Pertinent labs & imaging results that were available during my care of the patient were reviewed by me and considered in my medical decision making (see chart for details).     Patient presents with history of lightheadedness and "feeling like I'm going to fall out." He also reports associated chest pressure. Symptoms have been going on for about 4 hours. Patient denies any cardiac or neurological history in the past. Troponin negative 1. EKG showed normal sinus rhythm with no evidence of arrhythmia including WPW, long QT, LVH. CBC, BMP, UA unremarkable today. There is horizontal nystagmus present on exam. No focal deficits found on neurological exam. Patient is currently alert and oriented 3. Will obtain additional troponin,  head CT, CT angiogram of neck and head to rule out dissection. We'll also obtain orthostatic blood pressures. Will give migraine cocktail as this could possibly be complex migraine. We'll also give meclizine for possible vertigo. If imaging negative, dispo home with PCP follow up, return precautions and Meclizine.  Imaging pending at end of shift. Care signed out to [employer].  Final Clinical Impressions(s) / ED Diagnoses   Final diagnoses:  None    New Prescriptions New Prescriptions   No medications on file     Dietrich Pates, PA-C 02/24/17 0120    Derwood Kaplan, MD 02/24/17 [phone]

## 2017-11-29 ENCOUNTER — Ambulatory Visit (INDEPENDENT_AMBULATORY_CARE_PROVIDER_SITE_OTHER): Payer: No Typology Code available for payment source | Admitting: Podiatry

## 2017-11-29 ENCOUNTER — Encounter: Payer: Self-pay | Admitting: Podiatry

## 2017-11-29 ENCOUNTER — Ambulatory Visit (INDEPENDENT_AMBULATORY_CARE_PROVIDER_SITE_OTHER): Payer: No Typology Code available for payment source

## 2017-11-29 DIAGNOSIS — R609 Edema, unspecified: Secondary | ICD-10-CM

## 2017-11-29 DIAGNOSIS — M79671 Pain in right foot: Secondary | ICD-10-CM

## 2017-11-29 DIAGNOSIS — M779 Enthesopathy, unspecified: Secondary | ICD-10-CM

## 2017-11-29 NOTE — Progress Notes (Signed)
Subjective:    Patient ID: [name] [name], male    DOB: April 04, 1986, 32 y.o.   MRN: [id number]  HPI  [name] [name] presents the office today for concerns of right foot pain and swelling into the toes as well as the ball of foot which is been ongoing since January.  He did previously his primary care physician at the [facility] and he was previously given steroids which did help.  He is still taking tramadol for pain as well as diclofenac for anti-inflammatory.  He states his only states medication does not hurt.  He gets more swelling is numbness in the area.  States that over the swelling has improved however the third toe does remain swollen.  He does have a history of psoriasis but never did diagnosed with psoriatic arthritis.  The blood work that I could see he has not been tested for underlying arthritic processes at this time.  He denies any recent injury or trauma to his foot.  He has no redness or warmth associated the swelling.  He has no other concerns today.    Review of Systems  All other systems reviewed and are negative.  Past Medical History:  Diagnosis Date  . Arthritis   . Bulging lumbar disc   . Depression   . GERD (gastroesophageal reflux disease)   . Psoriasis   . Sleep apnea    does not use     Past Surgical History:  Procedure Laterality Date  . HAND SURGERY Right 2014  . PERCUTANEOUS PINNING Right 07/28/2015   Procedure: PALMARIS LONGUS GRAFT, PINNING METACARPAL JOINT ;  Surgeon: Daryll Brod, MD;  Location: [place];  Service: Orthopedics;  Laterality: Right;  . ULNAR COLLATERAL LIGAMENT REPAIR Right 07/28/2015   Procedure: RECONSTRUCTION RADIAL COLLATERAL LIGAMENT RIGHT THUMB;  Surgeon: Daryll Brod, MD;  Location: [facility];  Service: Orthopedics;  Laterality: Right;     Current Outpatient Medications:  .  traMADol (ULTRAM) 50 MG tablet, Take by mouth every 6 (six) hours as needed., Disp: , Rfl:  .  HYDROcodone-acetaminophen (NORCO) 10-325 MG  tablet, Take 1 tablet by mouth every 6 (six) hours as needed. (Patient not taking: Reported on 02/24/2017), Disp: 30 tablet, Rfl: 0 .  meclizine (ANTIVERT) 25 MG tablet, Take 1 tablet (25 mg total) by mouth 2 (two) times daily. (Patient not taking: Reported on 11/29/2017), Disp: 30 tablet, Rfl: 0 .  omeprazole (PRILOSEC) 20 MG capsule, Take 1 capsule (20 mg total) by mouth 2 (two) times daily before a meal. (Patient not taking: Reported on 02/24/2017), Disp: 60 capsule, Rfl: 0  Allergies  Allergen Reactions  . Penicillins Shortness Of Breath  . Amoxicillin Rash  . Sulfa Antibiotics Rash    Social History   Socioeconomic History  . Marital status: Single    Spouse name: Not on file  . Number of children: Not on file  . Years of education: Not on file  . Highest education level: Not on file  Social Needs  . Financial resource strain: Not on file  . Food insecurity - worry: Not on file  . Food insecurity - inability: Not on file  . Transportation needs - medical: Not on file  . Transportation needs - non-medical: Not on file  Occupational History  . Not on file  Tobacco Use  . Smoking status: Former Research scientist (life sciences)  . Smokeless tobacco: Current User    Types: Chew  Substance and Sexual Activity  . Alcohol use: Yes  Comment: occas  . Drug use: Yes    Types: Marijuana    Comment: used in oct  . Sexual activity: Yes    Birth control/protection: Surgical  Other Topics Concern  . Not on file  Social History Narrative  . Not on file        Objective:   Physical Exam  General: AAO x3, NAD  Dermatological: Skin is warm, dry and supple bilateral. Nails x 10 are well manicured; remaining integument appears unremarkable at this time. There are no open sores, no preulcerative lesions, no rash or signs of infection present.  Vascular: Dorsalis Pedis artery and Posterior Tibial artery pedal pulses are 2/4 bilateral with immedate capillary fill time. Pedal hair growth present. No varicosities  and no lower extremity edema present bilateral. There is no pain with calf compression, swelling, warmth, erythema.   Neruologic: Grossly intact via light touch bilateral. Protective threshold with Semmes Wienstein monofilament intact to all pedal sites bilateral.   Musculoskeletal: There is mild edema to the dorsal aspect of the right foot as well as plantarly mostly along the through fourth MTPJ's.  There is no erythema or increase in warmth.  There is no fluctuation or crepitation.  There is no pain with MPJ range of motion.  There is swelling specifically to the third digit.  No pain on palpation to the toe.  Muscular strength 5/5 in all groups tested bilateral.  Gait: Unassisted, Nonantalgic.     Assessment & Plan:  32 year old male with right foot pain, swelling, capsulitis -Treatment options discussed including all alternatives, risks, and complications -Etiology of symptoms were discussed -X-rays were obtained and reviewed with the patient.  No evidence of acute fracture or stress fracture identified today. -Given the swelling as well as the swelling to the third toe and concern for psoriatic arthritis.  I ordered blood work today continue basic arthritis panel including ESR, CRP, rheumatoid factor, ANA, anti-CCP, uric acid.  Pending the blood work will likely order an MRI.  Blood work consider possible referral to rheumatology.  If negative will get an MRI.  I dispensed metatarsal offloading pads for now.  We discussed a steroid injection. -Follow-up in 4 weeks or sooner if needed.  Call any questions or concerns.  Trula Slade DPM

## 2017-12-09 ENCOUNTER — Telehealth: Payer: Self-pay | Admitting: Podiatry

## 2017-12-09 NOTE — Telephone Encounter (Signed)
I was seen and the orders for me to get labs were supposed to be sent to the [facility] for me to get my lab work done there. After placing the pt on hold and speaking with Dr. Ardelle AntonWagoner I told the pt he was given a copy of the lab orders but that we could print them out and fax them for him. Pt stated he did get a copy but he called back the next day to let us know that the [facility] told him they need his treatment plan with the lab orders sent from our office. Pt gave me the phone number of [phone] to contact them and they could provide us with the fax number. I told [name] [name] I would pass the information along to the nurse.

## 2017-12-09 NOTE — Telephone Encounter (Signed)
Pt states will need to call [phone] for DR. Borum's contact information or fax. I spoke with Brittney and she gave me DR. Judeth CornfieldStephanie Borum's fax [phone].

## 2017-12-09 NOTE — Telephone Encounter (Signed)
[facility] states the lab work and orders have to be sent to pt's PCP at the [facility].

## 2017-12-09 NOTE — Telephone Encounter (Signed)
Faxed lab orders, clinical[facility] ATTN: Dr. Leonie DouglasStephanie Borum.

## 2018-01-24 ENCOUNTER — Ambulatory Visit (INDEPENDENT_AMBULATORY_CARE_PROVIDER_SITE_OTHER): Payer: Non-veteran care | Admitting: Podiatry

## 2018-01-24 ENCOUNTER — Encounter

## 2018-01-24 ENCOUNTER — Telehealth: Payer: Self-pay | Admitting: *Deleted

## 2018-01-24 ENCOUNTER — Encounter: Payer: Self-pay | Admitting: Podiatry

## 2018-01-24 DIAGNOSIS — M779 Enthesopathy, unspecified: Secondary | ICD-10-CM

## 2018-01-24 DIAGNOSIS — M7751 Other enthesopathy of right foot: Secondary | ICD-10-CM

## 2018-01-24 DIAGNOSIS — R609 Edema, unspecified: Secondary | ICD-10-CM

## 2018-01-24 MED ORDER — MELOXICAM 15 MG PO TABS: 15.0000 mg | ORAL_TABLET | Freq: Every day | ORAL | 0 refills | Status: DC

## 2018-01-24 NOTE — Telephone Encounter (Signed)
Dr. Ardelle Anton states pt's appt says he is to come in to discuss labs, and he does not see any results. I spoke with pt and he states he has an envelope from the [place] and it contains his labs. I requested copies when he arrives.

## 2018-01-26 NOTE — Progress Notes (Signed)
Subjective: [name] [name] presents the office today for evaluation of right third toe swelling and pain.  He recently had his blood work performed he presents today with a copy of the blood work to discuss the results.  Since I last saw him he states that the toe is still painful and swollen.  We will get the blood work performed he was on anti-inflammatories but he has since discontinued that medication.  He states he stopped it but there is no longer helping.Denies any systemic complaints such as fevers, chills, nausea, vomiting. No acute changes since last appointment, and no other complaints at this time.   He also states he has been having hand issues he has a history of psoriasis.  Objective: AAO x3, NAD DP/PT pulses palpable bilaterally, CRT less than 3 seconds Chronic swelling only to the right third toe mostly to the PIPJ but there is no associated erythema or increase in warmth.  The tenderness starts available MPJ and goes to the PIPJ.  There is decreased range of motion of the PIPJ but MPJ range of motion appears to be intact.  There is no other area tenderness identified to bilateral lower extremities. No open lesions or pre-ulcerative lesions.  No pain with calf compression, swelling, warmth, erythema  Assessment: Right third toe swelling/capsulitis, still concern for psoriatic arthritis  Plan: -All treatment options discussed with the patient including all alternatives, risks, complications.  -I reviewed the blood work with him.  It was all normal however given his history of psoriasis with the right third toe swelling as well as other joint issues I would refer him to rheumatology for further evaluation of this.  I will try to get him in with somebody in [place] but he may ultimately have to go back to the [place] for this but he would like to try to stay out of the [place] if possible. -Today I did try a steroid injection.  The injection was performed to the MPJ of the right third toe today.   The skin was prepped with Betadine and then a mixture of 0.5 cc of Kenalog 10, 0.5 cc of Marcaine plain was infiltrated into and around the MPJ without complications.  Postinjection care was discussed.  I elected to start at the level of the MPJ that with the pain starts from.  Post procedure instructions were discussed.  Monitor for infection.  I also tape the toe to help with swelling and I gave him some Coflex to help with this as well. -Patient encouraged to call the office with any questions, concerns, change in symptoms.   Vivi Barrack DPM

## 2018-01-29 NOTE — Telephone Encounter (Signed)
Faxed referral and clinicals, demographics to [employer] for pre-cert to [facility].

## 2018-01-30 ENCOUNTER — Encounter: Payer: Self-pay | Admitting: Podiatry

## 2018-01-30 NOTE — Telephone Encounter (Signed)
Received notice from [facility] stating pt is not assigned to [facility], but is assigned to [facility] [phone].

## 2018-01-30 NOTE — Telephone Encounter (Signed)
[facility] states pt's PCP has sent in referral to Rheumatology for pt. I asked how to fax Dr. Gabriel Rung clinicals to Rheumatology and Brett Canales stated fax directly to Rheumatology [phone].

## 2018-02-04 ENCOUNTER — Ambulatory Visit: Payer: Non-veteran care | Admitting: Podiatry

## 2018-02-20 ENCOUNTER — Ambulatory Visit (INDEPENDENT_AMBULATORY_CARE_PROVIDER_SITE_OTHER): Payer: No Typology Code available for payment source | Admitting: Podiatry

## 2018-02-20 ENCOUNTER — Encounter: Payer: Self-pay | Admitting: Podiatry

## 2018-02-20 DIAGNOSIS — M779 Enthesopathy, unspecified: Secondary | ICD-10-CM

## 2018-02-20 MED ORDER — TRIAMCINOLONE ACETONIDE 10 MG/ML IJ SUSP
10.0000 mg | Freq: Once | INTRAMUSCULAR | Status: AC
  Administered 2018-02-20: 10 mg

## 2018-02-20 NOTE — Progress Notes (Signed)
Subjective: Treylen presents the office today for follow-up evaluation of right third toe swelling.  States that after the injection he had significant improvement in symptoms.  He is still getting some swelling to his toes is more in the fourth toenail.  No recent injury.  For the last saw him he did see a rheumatologist and he was started on a biologic but is not sure which when it is.  He states that he does have psoriatic arthritis.  No other concerns today. Denies any systemic complaints such as fevers, chills, nausea, vomiting. No acute changes since last appointment, and no other complaints at this time.   Objective: AAO x3, NAD DP/PT pulses palpable bilaterally, CRT less than 3 seconds There is significantly improved swelling to the right third toe.  There is swelling to the right fourth toe but there is no erythema or increase in warmth and there is no open sores present.  Tenderness to palpation still to the digits although much improved.  No other areas of tenderness. No open lesions or pre-ulcerative lesions.  No pain with calf compression, swelling, warmth, erythema  Assessment: Capsulitis right foot, psoriatic arthritis  Plan: -All treatment options discussed with the patient including all alternatives, risks, complications.  -He was to have another injection today most of the fourth toe sites where he is doing the swelling.  Discussed risks and side effects of injection he wished to proceed.  The skin was prepped with Betadine and then a mixture of 0.5 cc of Kenalog 10 and 0.5 cc of Marcaine plain was infiltrated in general fourth MPJ without any complications.  Postinjection care was discussed.  Continue to follow with his rheumatologist as well.  I will see him back as needed.  Encouraged to call any questions -Patient encouraged to call the office with any questions, concerns, change in symptoms.   Vivi Barrack DPM

## 2019-05-12 IMAGING — CT CT ANGIO HEAD
1 of 12 series · 5 of 33 positions shown · IV contrast (OMNI 350)
Comparison: None.

CLINICAL DATA: Syncope and dizziness.  Rule out dissection.

EXAM:
CT ANGIOGRAPHY HEAD AND NECK
TECHNIQUE: Multidetector CT imaging of the head and neck was performed using
the standard protocol during bolus administration of intravenous
contrast. Multiplanar CT image reconstructions and MIPs were
obtained to evaluate the vascular anatomy. Carotid stenosis
measurements (when applicable) are obtained utilizing NASCET
criteria, using the distal internal carotid diameter as the
denominator.
CONTRAST:  50 mL Isovue 370

[Series 12: cta neck axial · axial · 0.39mm/px · z∈[-289,-55]mm · 5 of 352 slices shown]
[im 59/352  soft-tissue]
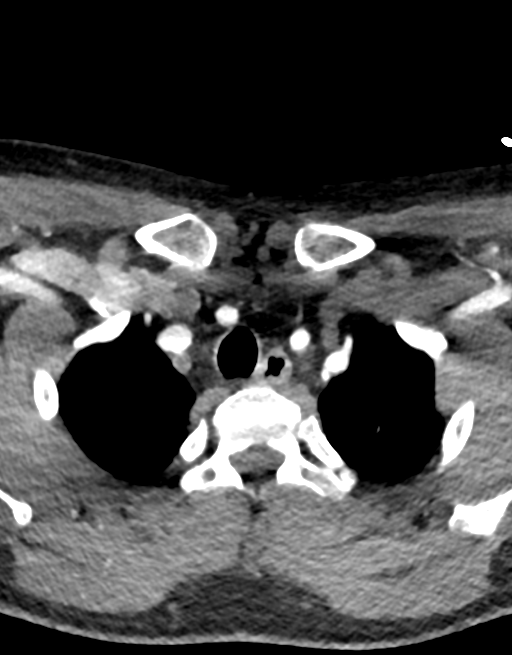
[im 118/352  bone]
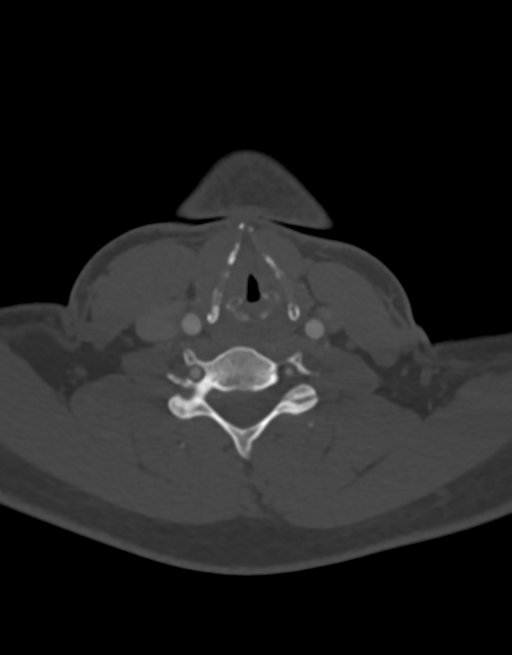
[im 176/352  soft-tissue]
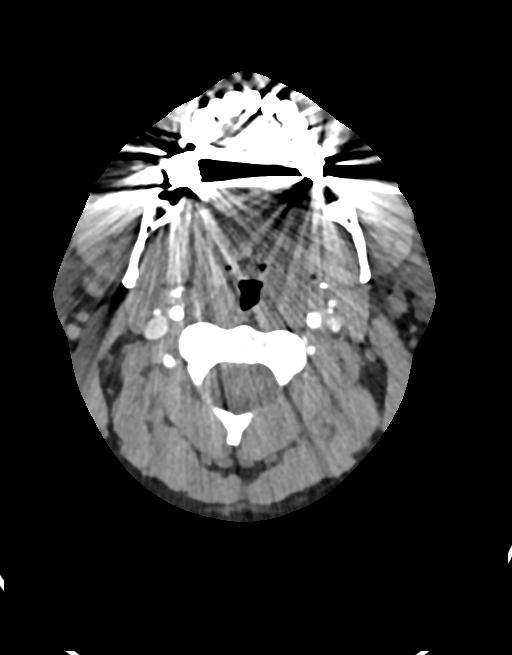
[im 235/352  bone]
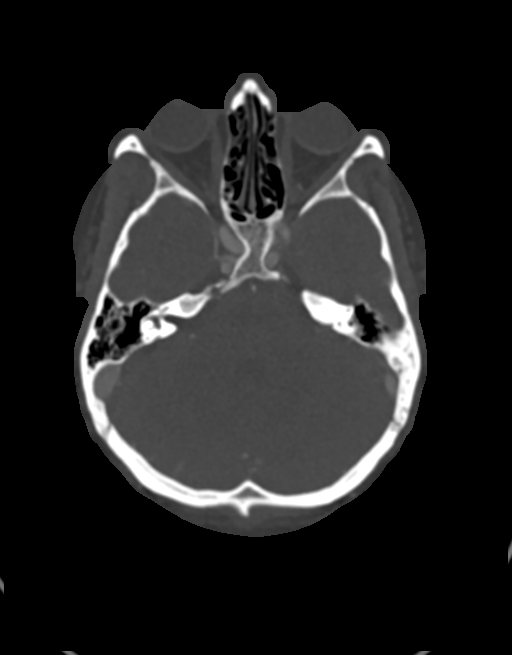
[im 293/352  soft-tissue]
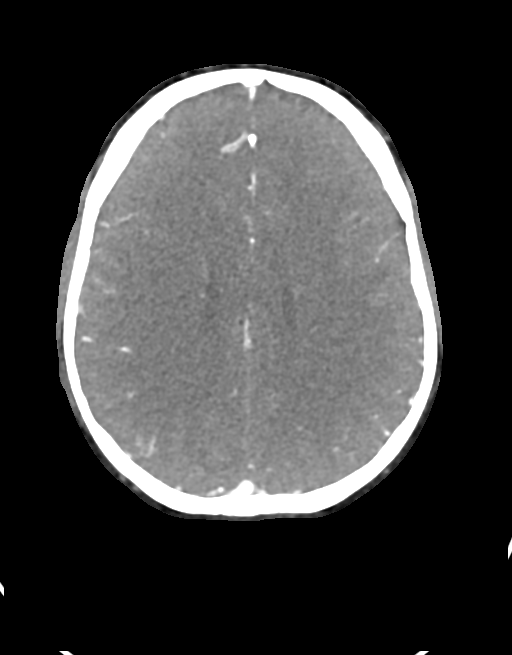

[5 of 33 positions shown; findings below may reference images not displayed]

FINDINGS: CT HEAD FINDINGS

Brain: No mass lesion, intraparenchymal hemorrhage or extra-axial
collection. No evidence of acute cortical infarct. Brain parenchyma
and CSF-containing spaces are normal for age.

Vascular: No hyperdense vessel or unexpected calcification.

Skull: Normal visualized skull base, calvarium and extracranial soft
tissues.

Sinuses/Orbits: No sinus fluid levels or advanced mucosal
thickening. No mastoid effusion. Normal orbits.

CTA NECK FINDINGS

Aortic arch: There is no aneurysm or dissection of the visualized
ascending aorta or aortic arch. There is a normal variant aortic
arch branching pattern with the brachiocephalic and left common
carotid arteries sharing a common origin. The visualized proximal
subclavian arteries are normal.

Right carotid system: The right common carotid origin is widely
patent. There is no common carotid or internal carotid artery
dissection or aneurysm. No hemodynamically significant stenosis.

Left carotid system: The left common carotid origin is widely
patent. There is no common carotid or internal carotid artery
dissection or aneurysm. No hemodynamically significant stenosis.

Vertebral arteries: The vertebral system is right dominant. Both
vertebral artery origins are normal. The congenitally diminutive
left vertebral artery terminates in the left posterior inferior
cerebellar artery. This is a normal variant.

Skeleton: There is no bony spinal canal stenosis. No lytic or
blastic lesions.

Other neck: The nasopharynx is clear. The oropharynx and hypopharynx
are normal. The epiglottis is normal. The supraglottic larynx,
glottis and subglottic larynx are normal. No retropharyngeal
collection. The parapharyngeal spaces are preserved. The parotid and
submandibular glands are normal. No sialolithiasis or salivary
ductal dilatation. The thyroid gland is normal. There is no cervical
lymphadenopathy.

Upper chest: No pneumothorax or pleural effusion. No nodules or
masses.

Review of the MIP images confirms the above findings

CTA HEAD FINDINGS

Anterior circulation:

--Intracranial internal carotid arteries: Normal.

--Anterior cerebral arteries: Normal.

--Middle cerebral arteries: Normal.

--Posterior communicating arteries: Present bilaterally.

Posterior circulation:

--Posterior cerebral arteries: Normal.

--Superior cerebellar arteries: Normal.

--Basilar artery: Normal.

--Anterior inferior cerebellar arteries: Present on the right. Not
visualized on the left.

--Posterior inferior cerebellar arteries: Normal. The left vertebral
artery terminates in PICA, which is a normal variant.

Venous sinuses: Hypoplastic left transverse sinus is a normal
variant.

Anatomic variants: Diminutive left vertebral artery terminating in
PICA. Hypoplastic left transverse venous sinus.

Delayed phase: No parenchymal contrast enhancement.

Review of the MIP images confirms the above findings
IMPRESSION: 1. Normal CTA of the head and neck.  No vertebral dissection.
2. Diminutive left vertebral artery terminating in PICA and
hypoplastic left transverse venous sinus are normal congenital
variants.

## 2022-04-24 ENCOUNTER — Other Ambulatory Visit: Payer: Self-pay

## 2022-04-24 ENCOUNTER — Emergency Department (HOSPITAL_BASED_OUTPATIENT_CLINIC_OR_DEPARTMENT_OTHER)
Admission: EM | Admit: 2022-04-24 | Discharge: 2022-04-24 | Disposition: A | Discharge status: Home / Self Care | Payer: No Typology Code available for payment source | Attending: Emergency Medicine | Admitting: Emergency Medicine

## 2022-04-24 ENCOUNTER — Encounter (HOSPITAL_BASED_OUTPATIENT_CLINIC_OR_DEPARTMENT_OTHER): Payer: Self-pay | Admitting: Emergency Medicine

## 2022-04-24 ENCOUNTER — Emergency Department (HOSPITAL_BASED_OUTPATIENT_CLINIC_OR_DEPARTMENT_OTHER): Payer: No Typology Code available for payment source | Admitting: Radiology

## 2022-04-24 DIAGNOSIS — R0602 Shortness of breath: Secondary | ICD-10-CM | POA: Diagnosis not present

## 2022-04-24 DIAGNOSIS — R0600 Dyspnea, unspecified: Secondary | ICD-10-CM | POA: Insufficient documentation

## 2022-04-24 DIAGNOSIS — R0789 Other chest pain: Secondary | ICD-10-CM | POA: Diagnosis not present

## 2022-04-24 DIAGNOSIS — R42 Dizziness and giddiness: Secondary | ICD-10-CM | POA: Insufficient documentation

## 2022-04-24 LAB — CBC
HCT: 42.8 % (ref 39.0–52.0)
Hemoglobin: 15.6 g/dL (ref 13.0–17.0)
MCH: 31.9 pg (ref 26.0–34.0)
MCHC: 36.4 g/dL — ABNORMAL HIGH (ref 30.0–36.0)
MCV: 87.5 fL (ref 80.0–100.0)
Platelets: 191 10*3/uL (ref 150–400)
RBC: 4.89 MIL/uL (ref 4.22–5.81)
RDW: 12.2 % (ref 11.5–15.5)
WBC: 4.2 10*3/uL (ref 4.0–10.5)
nRBC: 0 % (ref 0.0–0.2)

## 2022-04-24 LAB — TROPONIN I (HIGH SENSITIVITY)
Troponin I (High Sensitivity): 3 ng/L (ref <18)
Troponin I (High Sensitivity): 2 ng/L (ref <18)

## 2022-04-24 LAB — BASIC METABOLIC PANEL
Anion gap: 10 (ref 5–15)
BUN: 10 mg/dL (ref 6–20)
CO2: 24 mmol/L (ref 22–32)
Calcium: 9.7 mg/dL (ref 8.9–10.3)
Chloride: 106 mmol/L (ref 98–111)
Creatinine, Ser: 1.17 mg/dL (ref 0.61–1.24)
GFR, Estimated: >60 mL/min (ref >60)
Glucose, Bld: 83 mg/dL (ref 70–99)
Potassium: 3.8 mmol/L (ref 3.5–5.1)
Sodium: 140 mmol/L (ref 135–145)

## 2022-04-24 LAB — D-DIMER, QUANTITATIVE: D-Dimer, Quant: <0.27 ug/mL-FEU (ref 0.00–0.50)

## 2022-04-24 LAB — BRAIN NATRIURETIC PEPTIDE: B Natriuretic Peptide: 11.7 pg/mL (ref 0.0–100.0)

## 2022-04-24 MED ORDER — LACTATED RINGERS IV BOLUS
1000.0000 mL | Freq: Once | INTRAVENOUS | Status: AC
  Administered 2022-04-24: 1000 mL via INTRAVENOUS

## 2022-04-24 MED ORDER — MECLIZINE HCL 25 MG PO TABS: 25.0000 mg | ORAL_TABLET | Freq: Three times a day (TID) | ORAL | 0 refills | Status: DC | PRN

## 2022-04-24 NOTE — ED Provider Notes (Signed)
[facility] EMERGENCY DEPT Provider Note   CSN: [id number] Arrival date & time: 04/24/22  1447     History  Chief Complaint  Patient presents with   Shortness of Breath   Chest Pain    [name] [name] is a 36 y.o. male.   Shortness of Breath Associated symptoms: chest pain   Chest Pain Associated symptoms: shortness of breath      Patient has a history of gastroesophageal reflux disease, psoriasis on Humira.  Patient states he started having feeling of shortness of breath today.  He also felt lightheaded.  Patient feels like he cannot catch his breath.  Patient states it feels like when he has been at altitude.  No fevers.  He has had some discomfort in his chest associated with this shortness of breath.  No leg swelling.  No prior history of symptoms like this in the past  Home Medications Prior to Admission medications   Medication Sig Start Date End Date Taking? Authorizing Provider  meclizine (ANTIVERT) 25 MG tablet Take 1 tablet (25 mg total) by mouth 3 (three) times daily as needed for dizziness. 04/24/22  Yes Linwood Dibbles, MD  HYDROcodone-acetaminophen [facility]) 10-325 MG tablet Take 1 tablet by mouth every 6 (six) hours as needed. Patient not taking: Reported on 02/24/2017 07/28/15   Cindee Salt, MD  meclizine (ANTIVERT) 25 MG tablet Take 1 tablet (25 mg total) by mouth 2 (two) times daily. Patient not taking: Reported on 11/29/2017 02/24/17   Roxy Horseman, PA-C  meloxicam (MOBIC) 15 MG tablet Take 1 tablet (15 mg total) by mouth daily. 01/24/18   Vivi Barrack, DPM  omeprazole (PRILOSEC) 20 MG capsule Take 1 capsule (20 mg total) by mouth 2 (two) times daily before a meal. Patient not taking: Reported on 02/24/2017 09/14/15   Azalia Bilis, MD  traMADol (ULTRAM) 50 MG tablet Take by mouth every 6 (six) hours as needed.    [provider]      Allergies    Penicillins, Amoxicillin, and Sulfa antibiotics    Review of Systems   Review of Systems   Respiratory:  Positive for shortness of breath.   Cardiovascular:  Positive for chest pain.    Physical Exam Updated Vital Signs BP 133/83 (BP Location: Left Arm)   Pulse 78   Temp 98.7 F (37.1 C) (Oral)   Resp 12   Ht 1.753 m (5\' 9" )   Wt 86.2 kg   SpO2 100%   BMI 28.06 kg/m  Physical Exam Vitals and nursing note reviewed.  Constitutional:      General: He is not in acute distress.    Appearance: He is well-developed.  HENT:     Head: Normocephalic and atraumatic.     Right Ear: External ear normal.     Left Ear: External ear normal.  Eyes:     General: No scleral icterus.       Right eye: No discharge.        Left eye: No discharge.     Conjunctiva/sclera: Conjunctivae normal.  Neck:     Trachea: No tracheal deviation.  Cardiovascular:     Rate and Rhythm: Normal rate and regular rhythm.  Pulmonary:     Effort: Pulmonary effort is normal. No respiratory distress.     Breath sounds: Normal breath sounds. No stridor. No wheezing or rales.  Abdominal:     General: Bowel sounds are normal. There is no distension.     Palpations: Abdomen is soft.  Tenderness: There is no abdominal tenderness. There is no guarding or rebound.  Musculoskeletal:        General: No tenderness or deformity.     Cervical back: Neck supple.  Skin:    General: Skin is warm and dry.     Findings: No rash.  Neurological:     General: No focal deficit present.     Mental Status: He is alert.     Cranial Nerves: No cranial nerve deficit (no facial droop, extraocular movements intact, no slurred speech).     Sensory: No sensory deficit.     Motor: No abnormal muscle tone or seizure activity.     Coordination: Coordination normal.  Psychiatric:        Mood and Affect: Mood normal.     ED Results / Procedures / Treatments   Labs (all labs ordered are listed, but only abnormal results are displayed) Labs Reviewed  CBC - Abnormal; Notable for the following components:      Result  Value   MCHC 36.4 (*)    All other components within normal limits  BASIC METABOLIC PANEL  BRAIN NATRIURETIC PEPTIDE  D-DIMER, QUANTITATIVE  TROPONIN I (HIGH SENSITIVITY)  TROPONIN I (HIGH SENSITIVITY)    EKG None  Radiology DG Chest 2 View  Result Date: 04/24/2022 CLINICAL DATA:  Shortness of breath with chest tightness and dizziness for 1 day. EXAM: CHEST - 2 VIEW COMPARISON:  Radiographs 02/23/2017 FINDINGS: Improved inspiration on both views. The heart size and mediastinal contours are normal. The lungs are clear. There is no pleural effusion or pneumothorax. No acute osseous findings are identified. Telemetry leads overlie the chest. IMPRESSION: No evidence of active cardiopulmonary process. Electronically Signed   By: Carey Bullocks M.D.   On: 04/24/2022 15:13    Procedures Procedures    Medications Ordered in ED Medications  lactated ringers bolus 1,000 mL (0 mLs Intravenous Stopped 04/24/22 1643)    ED Course/ Medical Decision Making/ A&P Clinical Course as of 04/24/22 1859  Tue Apr 24, 2022  1600 DG Chest 2 View Chest x-ray without acute findings [JK]  1628 Brain natriuretic peptide Normal [JK]  1629 Troponin I (High Sensitivity) Normal [JK]  1629 D-dimer, quantitative Normal [JK]  1629 CBC(!) Normal [JK]    Clinical Course User Index [JK] Linwood Dibbles, MD                           Medical Decision Making Problems Addressed: Dyspnea, unspecified type: acute illness or injury that poses a threat to life or bodily functions  Amount and/or Complexity of Data Reviewed Labs: ordered. Decision-making details documented in ED Course. Radiology: ordered and independent interpretation performed. Decision-making details documented in ED Course.   Patient presented ED for evaluation of shortness of breath.  Patient also was feeling somewhat lightheaded.  ED work-up is reassuring.  No signs to suggest PE.  Serial troponins are normal.  Low risk heart score.  Patient  also describes some dizziness like the room is spinning.  That is the initial symptoms that he experienced it is possible he may be having some vertigo symptoms.  We will try a course of meclizine as an outpatient  Evaluation and diagnostic testing in the emergency department does not suggest an emergent condition requiring admission or immediate intervention beyond what has been performed at this time.  The patient is safe for discharge and has been instructed to return immediately for worsening symptoms, change  in symptoms or any other concerns.         Final Clinical Impression(s) / ED Diagnoses Final diagnoses:  Dyspnea, unspecified type    Rx / DC Orders ED Discharge Orders          Ordered    meclizine (ANTIVERT) 25 MG tablet  3 times daily PRN        04/24/22 1837              Linwood Dibbles, MD 04/24/22 1900

## 2022-04-24 NOTE — ED Notes (Signed)
Pt stood up out of bed and took two steps. Pt became very dizzy and stated thathe had to sit down. Oxygenation  and HR WDL. Pt placed back in bed and dizziness resolved.

## 2022-04-24 NOTE — ED Triage Notes (Signed)
Pt arrives to ED with c/o chest pain and shortness of breath. Pt reports the SOB started suddenly accompanied by dizziness this morning. Pt reports he has been having to stop and catch his breath, endorses dyspnea on exertion.

## 2022-04-24 NOTE — Discharge Instructions (Signed)
The test in the ED today were reassuring.  The cause of your symptoms are unclear however no signs of pneumonia, pulmonary embolism or heart attack.  follow-up with your primary care doctor later this week or early next week to be rechecked.  Return as needed for worsening symptoms

## 2022-08-24 ENCOUNTER — Emergency Department (HOSPITAL_BASED_OUTPATIENT_CLINIC_OR_DEPARTMENT_OTHER)
Admission: EM | Admit: 2022-08-24 | Discharge: 2022-08-24 | Disposition: A | Discharge status: Home / Self Care | Payer: No Typology Code available for payment source | Attending: Emergency Medicine | Admitting: Emergency Medicine

## 2022-08-24 ENCOUNTER — Emergency Department (HOSPITAL_BASED_OUTPATIENT_CLINIC_OR_DEPARTMENT_OTHER): Payer: No Typology Code available for payment source

## 2022-08-24 ENCOUNTER — Other Ambulatory Visit: Payer: Self-pay

## 2022-08-24 DIAGNOSIS — K760 Fatty (change of) liver, not elsewhere classified: Secondary | ICD-10-CM | POA: Insufficient documentation

## 2022-08-24 DIAGNOSIS — R1011 Right upper quadrant pain: Secondary | ICD-10-CM | POA: Diagnosis present

## 2022-08-24 LAB — COMPREHENSIVE METABOLIC PANEL
ALT: 42 U/L (ref 0–44)
AST: 20 U/L (ref 15–41)
Albumin: 4.9 g/dL (ref 3.5–5.0)
Alkaline Phosphatase: 56 U/L (ref 38–126)
Anion gap: 9 (ref 5–15)
BUN: 12 mg/dL (ref 6–20)
CO2: 25 mmol/L (ref 22–32)
Calcium: 9.7 mg/dL (ref 8.9–10.3)
Chloride: 103 mmol/L (ref 98–111)
Creatinine, Ser: 1 mg/dL (ref 0.61–1.24)
GFR, Estimated: >60 mL/min (ref >60)
Glucose, Bld: 93 mg/dL (ref 70–99)
Potassium: 3.5 mmol/L (ref 3.5–5.1)
Sodium: 137 mmol/L (ref 135–145)
Total Bilirubin: 0.7 mg/dL (ref 0.3–1.2)
Total Protein: 7.4 g/dL (ref 6.5–8.1)

## 2022-08-24 LAB — URINALYSIS, ROUTINE W REFLEX MICROSCOPIC
Bilirubin Urine: NEGATIVE
Glucose, UA: NEGATIVE mg/dL
Hgb urine dipstick: NEGATIVE
Ketones, ur: NEGATIVE mg/dL
Leukocytes,Ua: NEGATIVE
Nitrite: NEGATIVE
Specific Gravity, Urine: 1.029 (ref 1.005–1.030)
pH: 5.5 (ref 5.0–8.0)

## 2022-08-24 LAB — CBC
HCT: 42.6 % (ref 39.0–52.0)
Hemoglobin: 15.3 g/dL (ref 13.0–17.0)
MCH: 31.9 pg (ref 26.0–34.0)
MCHC: 35.9 g/dL (ref 30.0–36.0)
MCV: 88.9 fL (ref 80.0–100.0)
Platelets: 203 10*3/uL (ref 150–400)
RBC: 4.79 MIL/uL (ref 4.22–5.81)
RDW: 12.4 % (ref 11.5–15.5)
WBC: 6.5 10*3/uL (ref 4.0–10.5)
nRBC: 0 % (ref 0.0–0.2)

## 2022-08-24 LAB — LIPASE, BLOOD: Lipase: 16 U/L (ref 11–51)

## 2022-08-24 MED ORDER — OXYCODONE-ACETAMINOPHEN 5-325 MG PO TABS
1.0000 | ORAL_TABLET | Freq: Once | ORAL | Status: AC
  Administered 2022-08-24: 1 via ORAL
  Filled 2022-08-24: qty 1

## 2022-08-24 MED ORDER — KETOROLAC TROMETHAMINE 60 MG/2ML IM SOLN
60.0000 mg | Freq: Once | INTRAMUSCULAR | Status: AC
  Administered 2022-08-24: 60 mg via INTRAMUSCULAR
  Filled 2022-08-24: qty 2

## 2022-08-24 MED ORDER — IBUPROFEN 800 MG PO TABS: 800.0000 mg | ORAL_TABLET | Freq: Three times a day (TID) | ORAL | 0 refills | Status: DC | PRN

## 2022-08-24 MED ORDER — OXYCODONE-ACETAMINOPHEN 5-325 MG PO TABS: 1.0000 | ORAL_TABLET | Freq: Three times a day (TID) | ORAL | 0 refills | Status: DC | PRN

## 2022-08-24 MED ORDER — KETOROLAC TROMETHAMINE 30 MG/ML IJ SOLN: 30.0000 mg | Freq: Once | INTRAMUSCULAR | Status: DC

## 2022-08-24 NOTE — ED Triage Notes (Signed)
Pt present with RUQ pain worse with deep breath. Pt states it hurt a few weeks ago and then resolved. Today pain went from 0-100. Nauseated last night but no vomiting or diarrhea. No nausea today.

## 2022-08-24 NOTE — ED Provider Notes (Signed)
[facility] EMERGENCY DEPT Provider Note   CSN: [id number] Arrival date & time: 08/24/22  1831     History  Chief Complaint  Patient presents with   Abdominal Pain    [name] [name] is a 36 y.o. male presenting to ED with abdominal pain.  Patient reports onset of right upper quadrant abdominal pain that hurts with inspiration, gradual onset yesterday evening.  It is persisted into today.  He reports he has some poor appetite, no nausea or vomiting or diarrhea.  The pain is persistent and right upper quadrant, hurts when he moves or takes a deep breath in.  He has not had pain like this before, although years ago he reports he had a gallbladder ultrasound.  No history of abdominal surgery  HPI     Home Medications Prior to Admission medications   Medication Sig Start Date End Date Taking? Authorizing Provider  ibuprofen (ADVIL) 800 MG tablet Take 1 tablet (800 mg total) by mouth every 8 (eight) hours as needed for up to 21 doses for mild pain or moderate pain. 08/24/22  Yes Kizzi Overbey, Kermit Balo, MD  oxyCODONE-acetaminophen (PERCOCET/ROXICET) 5-325 MG tablet Take 1 tablet by mouth every 8 (eight) hours as needed for up to 8 doses for severe pain. 08/24/22  Yes Candiace West, Kermit Balo, MD  HYDROcodone-acetaminophen (NORCO) 10-325 MG tablet Take 1 tablet by mouth every 6 (six) hours as needed. Patient not taking: Reported on 02/24/2017 07/28/15   Cindee Salt, MD  meclizine (ANTIVERT) 25 MG tablet Take 1 tablet (25 mg total) by mouth 2 (two) times daily. Patient not taking: Reported on 11/29/2017 02/24/17   Roxy Horseman, PA-C  meclizine (ANTIVERT) 25 MG tablet Take 1 tablet (25 mg total) by mouth 3 (three) times daily as needed for dizziness. 04/24/22   Linwood Dibbles, MD  meloxicam (MOBIC) 15 MG tablet Take 1 tablet (15 mg total) by mouth daily. 01/24/18   Vivi Barrack, DPM  omeprazole (PRILOSEC) 20 MG capsule Take 1 capsule (20 mg total) by mouth 2 (two) times daily before a meal. Patient  not taking: Reported on 02/24/2017 09/14/15   Azalia Bilis, MD  traMADol (ULTRAM) 50 MG tablet Take by mouth every 6 (six) hours as needed.    [provider]      Allergies    Penicillins, Amoxicillin, and Sulfa antibiotics    Review of Systems   Review of Systems  Physical Exam Updated Vital Signs BP 100/87   Pulse 70   Temp 98 F (36.7 C) (Temporal)   Resp 18   Ht 5\' 9"  (1.753 m)   Wt 81.6 kg   SpO2 98%   BMI 26.58 kg/m  Physical Exam Constitutional:      General: He is not in acute distress. HENT:     Head: Normocephalic and atraumatic.  Eyes:     Conjunctiva/sclera: Conjunctivae normal.     Pupils: Pupils are equal, round, and reactive to light.  Cardiovascular:     Rate and Rhythm: Normal rate and regular rhythm.  Pulmonary:     Effort: Pulmonary effort is normal. No respiratory distress.  Abdominal:     General: There is no distension.     Tenderness: There is abdominal tenderness in the right upper quadrant. Positive signs include Murphy's sign.  Skin:    General: Skin is warm and dry.  Neurological:     General: No focal deficit present.     Mental Status: He is alert. Mental status is at baseline.  Psychiatric:        Mood and Affect: Mood normal.        Behavior: Behavior normal.     ED Results / Procedures / Treatments   Labs (all labs ordered are listed, but only abnormal results are displayed) Labs Reviewed  URINALYSIS, ROUTINE W REFLEX MICROSCOPIC - Abnormal; Notable for the following components:      Result Value   Protein, ur TRACE (*)    All other components within normal limits  LIPASE, BLOOD  COMPREHENSIVE METABOLIC PANEL  CBC    EKG None  Radiology US Abdomen Limited RUQ (LIVER/GB)  Result Date: 08/24/2022 CLINICAL DATA:  Right upper quadrant abdominal pain. EXAM: ULTRASOUND ABDOMEN LIMITED RIGHT UPPER QUADRANT COMPARISON:  None Available. FINDINGS: Gallbladder: No gallstones or wall thickening visualized. No sonographic  Murphy sign noted by sonographer. Common bile duct: Diameter: 3 mm Liver: There is diffuse increased liver echogenicity most commonly seen in the setting of fatty infiltration. Superimposed inflammation or fibrosis is not excluded. Clinical correlation is recommended. Portal vein is patent on color Doppler imaging with normal direction of blood flow towards the liver. Other: A 6 mm right renal interpolar calculus.  No hydronephrosis. IMPRESSION: 1. No gallstone. 2. Mild fatty liver. 3. A 6 mm right renal interpolar stone. Electronically Signed   By: Elgie Collard M.D.   On: 08/24/2022 21:53    Procedures Procedures    Medications Ordered in ED Medications  ketorolac (TORADOL) injection 60 mg (has no administration in time range)  oxyCODONE-acetaminophen (PERCOCET/ROXICET) 5-325 MG per tablet 1 tablet (has no administration in time range)    ED Course/ Medical Decision Making/ A&P                           Medical Decision Making Amount and/or Complexity of Data Reviewed Labs: ordered. Radiology: ordered.  Risk Prescription drug management.   This patient presents to the Emergency Department with complaint of abdominal pain. This involves an extensive number of treatment options, and is a complaint that carries with it a high risk of complications and morbidity.  The differential diagnosis includes, but is not limited to, gastritis vs biliary disease vs peptic ulcer vs constipation vs colitis vs UTI vs other  I ordered, reviewed, and interpreted labs, including BMP and CBC.  There were no immediate, life-threatening emergencies found in this labwork.  Patient's UA showed no signs of infection I ordered medication IV Toradol for abdominal pain and/or nausea I ordered imaging studies which included RUQ ultrasound  I independently visualized and interpreted imaging which showed fatty liver disease, no acute biliary pathology   After the interventions stated above, I reevaluated the  patient and found that they remained clinically stable.  Based on the patient's clinical exam, vital signs, risk factors, and ED testing, I felt that the patient's overall risk of life-threatening emergency such as bowel perforation, surgical emergency, or sepsis was quite low.  I suspect this clinical presentation is most consistent with nonspecific abdominal pain, but explained to the patient that this evaluation was not a definitive diagnostic workup.  At this time a low suspicion for other acute intra-abdominal surgical or infectious emergency to require further CT imaging.  Low suspicion for AAA, pneumonia, sepsis, or other life-threatening infection.  I recommended conservative management at home with continued ibuprofen and Tylenol as needed, follow-up with his PCP, and the patient is in agreement with this plan.   I discussed return precautions with  the patient. I felt the patient was clinically stable for discharge.         Final Clinical Impression(s) / ED Diagnoses Final diagnoses:  Right upper quadrant abdominal pain  Hepatic steatosis    Rx / DC Orders ED Discharge Orders          Ordered    oxyCODONE-acetaminophen (PERCOCET/ROXICET) 5-325 MG tablet  Every 8 hours PRN        08/24/22 2213    ibuprofen (ADVIL) 800 MG tablet  Every 8 hours PRN        08/24/22 2213              Terald Sleeper, MD 08/24/22 2214

## 2023-10-31 ENCOUNTER — Other Ambulatory Visit: Payer: Self-pay

## 2023-10-31 ENCOUNTER — Encounter (HOSPITAL_BASED_OUTPATIENT_CLINIC_OR_DEPARTMENT_OTHER): Payer: Self-pay | Admitting: Emergency Medicine

## 2023-10-31 ENCOUNTER — Emergency Department (HOSPITAL_BASED_OUTPATIENT_CLINIC_OR_DEPARTMENT_OTHER): Payer: Non-veteran care

## 2023-10-31 ENCOUNTER — Emergency Department (HOSPITAL_BASED_OUTPATIENT_CLINIC_OR_DEPARTMENT_OTHER)
Admission: EM | Admit: 2023-10-31 | Discharge: 2023-10-31 | Disposition: A | Discharge status: Home / Self Care | Payer: Non-veteran care | Attending: Emergency Medicine | Admitting: Emergency Medicine

## 2023-10-31 DIAGNOSIS — G500 Trigeminal neuralgia: Secondary | ICD-10-CM | POA: Diagnosis not present

## 2023-10-31 DIAGNOSIS — R519 Headache, unspecified: Secondary | ICD-10-CM | POA: Diagnosis present

## 2023-10-31 HISTORY — DX: Anxiety disorder, unspecified: F41.9

## 2023-10-31 LAB — BASIC METABOLIC PANEL
Anion gap: 11 (ref 5–15)
BUN: 15 mg/dL (ref 6–20)
CO2: 25 mmol/L (ref 22–32)
Calcium: 9.5 mg/dL (ref 8.9–10.3)
Chloride: 101 mmol/L (ref 98–111)
Creatinine, Ser: 0.97 mg/dL (ref 0.61–1.24)
GFR, Estimated: >60 mL/min (ref >60)
Glucose, Bld: 98 mg/dL (ref 70–99)
Potassium: 4 mmol/L (ref 3.5–5.1)
Sodium: 137 mmol/L (ref 135–145)

## 2023-10-31 LAB — CBC
HCT: 47.6 % (ref 39.0–52.0)
Hemoglobin: 17.1 g/dL — ABNORMAL HIGH (ref 13.0–17.0)
MCH: 32.2 pg (ref 26.0–34.0)
MCHC: 35.9 g/dL (ref 30.0–36.0)
MCV: 89.6 fL (ref 80.0–100.0)
Platelets: 211 10*3/uL (ref 150–400)
RBC: 5.31 MIL/uL (ref 4.22–5.81)
RDW: 12.2 % (ref 11.5–15.5)
WBC: 6.1 10*3/uL (ref 4.0–10.5)
nRBC: 0 % (ref 0.0–0.2)

## 2023-10-31 MED ORDER — SODIUM CHLORIDE 0.9 % IV SOLN
1000.0000 mg | Freq: Once | INTRAVENOUS | Status: DC
  Filled 2023-10-31: qty 20

## 2023-10-31 MED ORDER — SODIUM CHLORIDE 0.9 % IV SOLN: 10.0000 mg/kg | Freq: Once | INTRAVENOUS | Status: DC

## 2023-10-31 MED ORDER — LORAZEPAM 2 MG/ML IJ SOLN
1.0000 mg | Freq: Once | INTRAMUSCULAR | Status: AC
  Administered 2023-10-31: 1 mg via INTRAVENOUS
  Filled 2023-10-31: qty 1

## 2023-10-31 MED ORDER — CARBAMAZEPINE ER 200 MG PO TB12: 200.0000 mg | ORAL_TABLET | Freq: Every day | ORAL | 2 refills | Status: DC

## 2023-10-31 MED ORDER — PHENYTOIN SODIUM 50 MG/ML IJ SOLN
INTRAMUSCULAR | Status: AC
  Administered 2023-10-31: 1000 mg
  Filled 2023-10-31: qty 20

## 2023-10-31 MED ORDER — GADOBUTROL 1 MMOL/ML IV SOLN
9.0000 mL | Freq: Once | INTRAVENOUS | Status: AC | PRN
  Administered 2023-10-31: 9 mL via INTRAVENOUS

## 2023-10-31 MED ORDER — LORAZEPAM 1 MG PO TABS: 1.0000 mg | ORAL_TABLET | Freq: Three times a day (TID) | ORAL | 0 refills | Status: AC | PRN

## 2023-10-31 NOTE — ED Provider Notes (Signed)
 [place] EMERGENCY DEPARTMENT AT [facility] Provider Note   CSN: [id number] Arrival date & time: 10/31/23  9187     History  Chief Complaint  Patient presents with   Facial Pain    [name] [name] is a 38 y.o. male with past medical history significant for GERD, anxiety, right-sided pterygium.,  Arthritis who presents with concern for shooting pain across forehead, right cheek that started last night.  Reports pressure above right eye.  No other weakness, numbness, vision changes, hearing loss.  He denies any dental pain, dental disease.  No previous history of same.  No history of facial trauma.  HPI     Home Medications Prior to Admission medications   Medication Sig Start Date End Date Taking? Authorizing Provider  carbamazepine  (TEGRETOL -XR) 200 MG 12 hr tablet Take 1 tablet (200 mg total) by mouth daily. 10/31/23  Yes Georgiann Neider H, PA-C  LORazepam  (ATIVAN ) 1 MG tablet Take 1 tablet (1 mg total) by mouth every 8 (eight) hours as needed for anxiety. 10/31/23  Yes Alyvia Derk H, PA-C  HYDROcodone -acetaminophen  (NORCO) 10-325 MG tablet Take 1 tablet by mouth every 6 (six) hours as needed. Patient not taking: Reported on 02/24/2017 07/28/15   Murrell Kuba, MD  ibuprofen  (ADVIL ) 800 MG tablet Take 1 tablet (800 mg total) by mouth every 8 (eight) hours as needed for up to 21 doses for mild pain or moderate pain. 08/24/22   Cottie Donnice PARAS, MD  meclizine  (ANTIVERT ) 25 MG tablet Take 1 tablet (25 mg total) by mouth 2 (two) times daily. Patient not taking: Reported on 11/29/2017 02/24/17   Vicky Charleston, PA-C  meclizine  (ANTIVERT ) 25 MG tablet Take 1 tablet (25 mg total) by mouth 3 (three) times daily as needed for dizziness. 04/24/22   Randol Simmonds, MD  meloxicam  (MOBIC ) 15 MG tablet Take 1 tablet (15 mg total) by mouth daily. 01/24/18   Gershon Donnice SAUNDERS, DPM  omeprazole  (PRILOSEC) 20 MG capsule Take 1 capsule (20 mg total) by mouth 2 (two) times daily before a  meal. Patient not taking: Reported on 02/24/2017 09/14/15   Baxter Drivers, MD  oxyCODONE -acetaminophen  (PERCOCET/ROXICET) 5-325 MG tablet Take 1 tablet by mouth every 8 (eight) hours as needed for up to 8 doses for severe pain. 08/24/22   Cottie Donnice PARAS, MD  traMADol  (ULTRAM ) 50 MG tablet Take by mouth every 6 (six) hours as needed.    [provider]      Allergies    Penicillins, Amoxicillin, Humira (2 pen) [adalimumab], and Sulfa antibiotics    Review of Systems   Review of Systems  All other systems reviewed and are negative.   Physical Exam Updated Vital Signs BP (!) 140/74   Pulse 84   Temp 98.2 F (36.8 C) (Oral)   Resp 18   Wt 88.5 kg   SpO2 99%   BMI 28.80 kg/m  Physical Exam Vitals and nursing note reviewed.  Constitutional:      General: He is not in acute distress.    Appearance: Normal appearance.  HENT:     Head: Normocephalic and atraumatic.  Eyes:     General:        Right eye: No discharge.        Left eye: No discharge.     Comments: Pupils equal round reactive to light.  He has a pterygium on the right eye that he reports is chronic  Cardiovascular:     Rate and Rhythm: Normal rate and regular  rhythm.  Pulmonary:     Effort: Pulmonary effort is normal. No respiratory distress.  Musculoskeletal:        General: No deformity.  Skin:    General: Skin is warm and dry.  Neurological:     Mental Status: He is alert and oriented to person, place, and time.     Comments: Excruciating pain to the touch in V1-V2 distribution of trigeminal nerve, V3 spared. Some increased tearing on that side.  Psychiatric:        Mood and Affect: Mood normal.        Behavior: Behavior normal.     ED Results / Procedures / Treatments   Labs (all labs ordered are listed, but only abnormal results are displayed) Labs Reviewed  CBC - Abnormal; Notable for the following components:      Result Value   Hemoglobin 17.1 (*)    All other components within normal  limits  BASIC METABOLIC PANEL    EKG None  Radiology MR Brain W and Wo Contrast Result Date: 10/31/2023 CLINICAL DATA:  Headache, neuro deficit, pain and numbness to right-side of head and face EXAM: MRI HEAD WITHOUT AND WITH CONTRAST TECHNIQUE: Multiplanar, multiecho pulse sequences of the brain and surrounding structures were obtained without and with intravenous contrast. CONTRAST:  9mL GADAVIST  GADOBUTROL  1 MMOL/ML IV SOLN COMPARISON:  No prior MRI available, correlation is made with CT head 02/24/2017 FINDINGS: Brain: No restricted diffusion to suggest acute or subacute infarct. No abnormal parenchymal or meningeal enhancement. No acute hemorrhage, mass, mass effect, or midline shift. No hydrocephalus or extra-axial collection. Pituitary and craniocervical junction within normal limits. No hemosiderin deposition to suggest remote hemorrhage. Normal cerebral volume for age. No evidence of remote cortical or lacunar infarct. No evidence of demyelinating disease. Vascular: Normal arterial flow voids. Normal arterial and venous enhancement. Skull and upper cervical spine: Normal marrow signal. Sinuses/Orbits: Mild mucosal thickening maxillary sinuses. No acute finding in the orbits. Other: Fluid in the left mastoid air cells. IMPRESSION: No acute intracranial process. No etiology is seen for the patient's symptoms. Electronically Signed   By: Donald Campion M.D.   On: 10/31/2023 14:14    Procedures Procedures    Medications Ordered in ED Medications  phenytoin  (DILANTIN ) 1,000 mg in sodium chloride  0.9 % 100 mL IVPB (1,000 mg Intravenous Not Given 10/31/23 1416)  LORazepam  (ATIVAN ) injection 1 mg (1 mg Intravenous Given 10/31/23 1310)  phenytoin  (DILANTIN ) 50 MG/ML injection (1,000 mg  Given 10/31/23 1322)  gadobutrol  (GADAVIST ) 1 MMOL/ML injection 9 mL (9 mLs Intravenous Contrast Given 10/31/23 1338)    ED Course/ Medical Decision Making/ A&P Clinical Course as of 10/31/23 1601  Thu Oct 31, 2023   1235 Possible 5-10mg /kg repeat in 2 hours if no good response. 10mg /kg initial [CP]    Clinical Course User Index [CP] Rosan Sherlean DEL, PA-C                                 Medical Decision Making Amount and/or Complexity of Data Reviewed Labs: ordered. Radiology: ordered.  Risk Prescription drug management.   This patient is a 38 y.o. male  who presents to the ED for concern of face pain, headache.   Differential diagnoses prior to evaluation: The emergent differential diagnosis includes, but is not limited to, most consistent with trigeminal neuralgia, considered stroke, atypical migraine presentation, other trigeminal nerve palsy, dental pain, dental abscess, versus other. This  is not an exhaustive differential.   Past Medical History / Co-morbidities / Social History: Arthritis, GERD, depression, anxiety  Physical Exam: Physical exam performed. The pertinent findings include: Excruciating pain to the touch in V1-V2 distribution of trigeminal nerve, V3 spared. Some increased tearing on that side.   Pupils equal round reactive to light.  He has a pterygium on the right eye that he reports is chronic  Lab Tests/Imaging studies: I personally interpreted labs/imaging and the pertinent results include: CBC overall unremarkable, some hemoconcentration, hemoglobin 17.1.  BMP unremarkable.  I independently interpreted MR brain w wo contrast which shows no intracranial abnormality to explain patient's symptoms. I agree with the radiologist interpretation.  Medications: I ordered medication including Dilantin  for acute trigeminal neuralgia, Ativan  for anxiety.  I have reviewed the patients home medicines and have made adjustments as needed.   Consults: Spoke with the neurologist who recommended MR to evaluate for possible structural abnormality.  Discharge Dilantin  for acute symptom control in the emergency department and discharged on carbamazepine  with close neurology  follow-up.  Disposition: After consideration of the diagnostic results and the patients response to treatment, I feel that patient with findings consistent with acute trigeminal neuralgia, no evidence of acute intracranial abnormality, will discharge with carbamazepine  and ambulatory referral to neurology.   emergency department workup does not suggest an emergent condition requiring admission or immediate intervention beyond what has been performed at this time. The plan is: as above. The patient is safe for discharge and has been instructed to return immediately for worsening symptoms, change in symptoms or any other concerns.  Final Clinical Impression(s) / ED Diagnoses Final diagnoses:  Trigeminal neuralgia of right side of face    Rx / DC Orders ED Discharge Orders          Ordered    carbamazepine  (TEGRETOL -XR) 200 MG 12 hr tablet  Daily        10/31/23 1601    Ambulatory referral to Neurology       Comments: An appointment is requested in approximately: 1 week Trigeminal neuralgia follow up   10/31/23 1601    LORazepam  (ATIVAN ) 1 MG tablet  Every 8 hours PRN        10/31/23 1601              Ardith Test, [place] H, PA-C 10/31/23 1601    Zackowski, Scott, MD 11/01/23 [phone]

## 2023-10-31 NOTE — ED Notes (Signed)
 Report given to the next RN.Marland KitchenMarland Kitchen

## 2023-10-31 NOTE — Plan of Care (Signed)
 Asked for recommendations for suspected acute trigeminal neuralgia, new onset, severe pain  Recommend  MRI brain w/ and w/o contrast to exclude acute pathology Consider fosphenytoin loading dose for acute pain if no contraindications noting I have not fully reviewed chart ([facility]) If MRI brain is reassuring and pain is managed may have outpatient follow-up, please do not hesitate to reach out if additional questions arise or full consult is felt to be needed   These are curbside recommendations based upon the information provided to me by requesting provider and do not replace a full detailed consult  Lola Jernigan MD-PhD [facility] [phone] Available 8 AM to 8 PM, outside these hours please contact Neurologist on call listed on AMION

## 2023-10-31 NOTE — Discharge Instructions (Addendum)
 Please take the new medication we have prescribed daily, follow up closely with the neurologist. They should give you a call to schedule an appointment with the neurologist. Please return if you have significant worsening of pain or return of symptoms despite treatment.

## 2023-10-31 NOTE — Progress Notes (Signed)
 Pharmacy Note Paramedic Sidra Mall reported that pt experienced burning in his arm during phenytoin  infusion. Line was checked and no infiltration. He ask if fluids should be run with it. Discussed trying that and if no resolution, could slow the infusion down to run it over ~1 hour instead of 30 minutes.  Vernell Meier, PharmD, BCPS, BCEMP Clinical Pharmacist Please see AMION for all pharmacy numbers 10/31/2023 2:45 PM

## 2023-10-31 NOTE — ED Notes (Signed)
 Pt given discharge instructions and reviewed prescriptions. Opportunities given for questions. Pt verbalizes understanding. PIV removed x1. Jillyn Hidden, RN

## 2023-10-31 NOTE — ED Triage Notes (Signed)
 Starting last night, "shooting pain" across forehead, highly sensitive to touch. Reports as pressure above RT eye. Speech clear, aox4, denies vision problems

## 2023-11-07 ENCOUNTER — Other Ambulatory Visit: Payer: Self-pay | Admitting: Neurology

## 2023-11-07 ENCOUNTER — Ambulatory Visit: Payer: No Typology Code available for payment source | Admitting: Neurology

## 2023-11-07 ENCOUNTER — Encounter: Payer: Self-pay | Admitting: Neurology

## 2023-11-07 ENCOUNTER — Encounter (HOSPITAL_BASED_OUTPATIENT_CLINIC_OR_DEPARTMENT_OTHER): Payer: Self-pay

## 2023-11-07 ENCOUNTER — Other Ambulatory Visit: Payer: Self-pay

## 2023-11-07 VITALS — BP 141/92 | HR 88 | Ht 69.0 in | Wt 203.0 lb

## 2023-11-07 DIAGNOSIS — G500 Trigeminal neuralgia: Secondary | ICD-10-CM

## 2023-11-07 DIAGNOSIS — R519 Headache, unspecified: Secondary | ICD-10-CM | POA: Diagnosis present

## 2023-11-07 LAB — CBC WITH DIFFERENTIAL/PLATELET
Abs Immature Granulocytes: 0 10*3/uL (ref 0.00–0.07)
Basophils Absolute: 0.1 10*3/uL (ref 0.0–0.1)
Basophils Relative: 1 %
Eosinophils Absolute: 0.1 10*3/uL (ref 0.0–0.5)
Eosinophils Relative: 2 %
HCT: 44.2 % (ref 39.0–52.0)
Hemoglobin: 16 g/dL (ref 13.0–17.0)
Immature Granulocytes: 0 %
Lymphocytes Relative: 33 %
Lymphs Abs: 1.7 10*3/uL (ref 0.7–4.0)
MCH: 32.7 pg (ref 26.0–34.0)
MCHC: 36.2 g/dL — ABNORMAL HIGH (ref 30.0–36.0)
MCV: 90.2 fL (ref 80.0–100.0)
Monocytes Absolute: 0.6 10*3/uL (ref 0.1–1.0)
Monocytes Relative: 11 %
Neutro Abs: 2.7 10*3/uL (ref 1.7–7.7)
Neutrophils Relative %: 53 %
Platelets: 174 10*3/uL (ref 150–400)
RBC: 4.9 MIL/uL (ref 4.22–5.81)
RDW: 12.1 % (ref 11.5–15.5)
WBC: 5.1 10*3/uL (ref 4.0–10.5)
nRBC: 0 % (ref 0.0–0.2)

## 2023-11-07 LAB — COMPREHENSIVE METABOLIC PANEL
ALT: 40 U/L (ref 0–44)
AST: 26 U/L (ref 15–41)
Albumin: 4.6 g/dL (ref 3.5–5.0)
Alkaline Phosphatase: 50 U/L (ref 38–126)
Anion gap: 13 (ref 5–15)
BUN: 9 mg/dL (ref 6–20)
CO2: 22 mmol/L (ref 22–32)
Calcium: 9.3 mg/dL (ref 8.9–10.3)
Chloride: 101 mmol/L (ref 98–111)
Creatinine, Ser: 1.02 mg/dL (ref 0.61–1.24)
GFR, Estimated: >60 mL/min (ref >60)
Glucose, Bld: 85 mg/dL (ref 70–99)
Potassium: 3.7 mmol/L (ref 3.5–5.1)
Sodium: 136 mmol/L (ref 135–145)
Total Bilirubin: 0.6 mg/dL (ref 0.0–1.2)
Total Protein: 7.6 g/dL (ref 6.5–8.1)

## 2023-11-07 MED ORDER — VALACYCLOVIR HCL 1 G PO TABS: 1000.0000 mg | ORAL_TABLET | Freq: Three times a day (TID) | ORAL | 0 refills | Status: DC

## 2023-11-07 MED ORDER — GABAPENTIN 300 MG PO CAPS: 900.0000 mg | ORAL_CAPSULE | Freq: Three times a day (TID) | ORAL | 11 refills | Status: DC

## 2023-11-07 MED ORDER — METHYLPREDNISOLONE 4 MG PO TBPK: ORAL_TABLET | ORAL | 0 refills | Status: DC

## 2023-11-07 MED ORDER — CARBAMAZEPINE ER 200 MG PO TB12: 400.0000 mg | ORAL_TABLET | Freq: Four times a day (QID) | ORAL | 6 refills | Status: DC

## 2023-11-07 MED ORDER — LIDOCAINE 4 % EX SOLN: CUTANEOUS | 3 refills | Status: DC

## 2023-11-07 MED ORDER — PREGABALIN 100 MG PO CAPS
100.0000 mg | ORAL_CAPSULE | Freq: Three times a day (TID) | ORAL | 5 refills | Status: DC
Start: 2023-11-07 — End: 2023-11-11

## 2023-11-07 NOTE — ED Triage Notes (Addendum)
Pt reports he is here today due to facial pain. Pt reports it started 10/30/2023.Pt reports he was seen here for same. Pt reports he went to the neurologist and was prescribed meds but states he is unable to get the meds. Pt reports worsening pain. Pt reports it is becoming difficult to swallow. Pt speech is clear, a&OX4, neuro assessment intact. Pt denies any vision problems. Pt reports airway intact.Pt reports he is unable to eat or drink due to the pain in throat.

## 2023-11-07 NOTE — ED Notes (Signed)
Pt wanted to report to this RN that he took Carbamazepine while waiting for a room.

## 2023-11-07 NOTE — Patient Instructions (Signed)
Meds ordered this encounter   methylPREDNISolone (MEDROL DOSEPAK) 4 MG TBPK tablet    Sig: Take as instructed    Dispense:  21 tablet    Refill:  0   pregabalin (LYRICA) 100 MG capsule    Sig: Take 1 capsule (100 mg total) by mouth 3 (three) times daily.    Dispense:  90 capsule    Refill:  5   carbamazepine (TEGRETOL-XR) 200 MG 12 hr tablet    Sig: Take 2 tablets (400 mg total) by mouth 4 (four) times daily.    Dispense:  240 tablet    Refill:  6

## 2023-11-07 NOTE — Progress Notes (Signed)
 Chief Complaint  Patient presents with   New Patient (Initial Visit)    Rm14, alone, NP Internal referral for trigeminal neuralgia: last Thursday had shooting pain on right side of cheek up the whole right side of head, unable to swallow well, only eaten three times since last Thursday due to the pain. Seen at [facility] er Thursday did brain mri.       ASSESSMENT AND PLAN  [name] [name] is a 38 y.o. male   Right trigeminal area pain involving right V1, V2 territory  MRI of the brain with and without contrast was normal  No rash noted at the trigeminal territory  The atypical features is a sudden onset, skin allodynia  Medrol pack, Valtrex 1gram tid for possible shingles  Higher dose of Tegretol-XR up to 200 mg 2 tablets 4 times a day, add on Lyrica 100 mg 3 times a day.  Lab today, including Tegretol level  DIAGNOSTIC DATA (LABS, IMAGING, TESTING) - I reviewed patient records, labs, notes, testing and imaging myself where available.   MEDICAL HISTORY:  [name] [name] is a 38 year old male, seen in request by his primary care from [facility] Dr. Jess Barters, [place], for evaluation of right trigeminal pain, initial evaluation November 07, 2023  History is obtained from the patient and review of electronic medical records. I personally reviewed pertinent available imaging films in PACS.   PMHx of  Anxiety Rheumatoid arthritis Psoriatic arthritis GERD Left ulnar fracture  He went to bed with normal status on February 5, woke up early morning February 6 around 2 AM, noticed significant right facial pain, sheets touching his right face will trigger significant shooting pain, involving right forehead, upper and lower eyelid, cheek area, right upper tooth  Had a persistent sharp radiating pain since his onset, denies rash broke out, skin sensitive to touch, could not truly swallow, only eat 3 meals over past 1 week,  Presented to emergency room had MRI of the brain  with without contrast October 31, 2023 that was normal Normal BMP CBC  He was given the prescription of Tegretol, 200 mg taking up to 2 tablets 3 times a day, still have significant pain, tolerate the medication well  He took gabapentin for his low back pain in the past, could not tolerated due to side effect dizziness,  PHYSICAL EXAM:   Vitals:   11/07/23 1402  BP: (!) 141/92  Pulse: 88  Weight: 203 lb (92.1 kg)  Height: 5\' 9"  (1.753 m)     Body mass index is 29.98 kg/m.  PHYSICAL EXAMNIATION:  Gen: NAD, conversant, well nourised, well groomed                     Cardiovascular: Regular rate rhythm, no peripheral edema, warm, nontender. Eyes: Conjunctivae clear without exudates or hemorrhage Neck: Supple, no carotid bruits. Pulmonary: Clear to auscultation bilaterally   NEUROLOGICAL EXAM:  MENTAL STATUS: Speech/cognition: Awake, alert, oriented to history taking and casual conversation CRANIAL NERVES: CN II: Visual fields are full to confrontation. Pupils are round equal and briskly reactive to light. CN III, IV, VI: extraocular movement are normal. No ptosis. CN V: Facial sensation is intact to light touch, bilateral corneal reflexes were normal and symmetric, no rash noticed CN VII: Face is symmetric with normal eye closure  CN VIII: Hearing is normal to causal conversation. CN IX, X: Phonation is normal. CN XI: Head turning and shoulder shrug are intact  MOTOR: There is no pronator  drift of out-stretched arms. Muscle bulk and tone are normal. Muscle strength is normal.  REFLEXES: Reflexes are 2+ and symmetric at the biceps, triceps, knees, and ankles. Plantar responses are flexor.  SENSORY: Intact to light touch, pinprick and vibratory sensation are intact in fingers and toes.  COORDINATION: There is no trunk or limb dysmetria noted.  GAIT/STANCE: Posture is normal. Gait is steady with normal steps, base, arm swing, and turning. Heel and toe walking are  normal. Tandem gait is normal.  Romberg is absent.  REVIEW OF SYSTEMS:  Full 14 system review of systems performed and notable only for as above All other review of systems were negative.   ALLERGIES: Allergies  Allergen Reactions   Penicillins Shortness Of Breath    Other Reaction(s): Airway constriction, Pharyngeal swelling, Throat swelling, Unknown   Oxycodone Itching   Amoxicillin Rash   Humira (2 Pen) [Adalimumab] Rash    Still taking   Sulfa Antibiotics Rash    Other Reaction(s): Unknown   Sulfacetamide Sodium Rash    HOME MEDICATIONS: Current Outpatient Medications  Medication Sig Dispense Refill   adalimumab (HUMIRA, 2 PEN,) 40 MG/0.8ML AJKT pen Take 40 mg by mouth every 14 (fourteen) days.     carbamazepine (TEGRETOL-XR) 200 MG 12 hr tablet Take 1 tablet (200 mg total) by mouth daily. 30 tablet 2   LORazepam (ATIVAN) 1 MG tablet Take 1 tablet (1 mg total) by mouth every 8 (eight) hours as needed for anxiety. 8 tablet 0   meclizine (ANTIVERT) 25 MG tablet Take 1 tablet (25 mg total) by mouth 3 (three) times daily as needed for dizziness. 30 tablet 0   No current facility-administered medications for this visit.    PAST MEDICAL HISTORY: Past Medical History:  Diagnosis Date   Anxiety    Arthritis    Bulging lumbar disc    Depression    GERD (gastroesophageal reflux disease)    Psoriasis    Sleep apnea    does not use     PAST SURGICAL HISTORY: Past Surgical History:  Procedure Laterality Date   HAND SURGERY Right 09/24/2012   PERCUTANEOUS PINNING Right 07/28/2015   Procedure: PALMARIS LONGUS GRAFT, PINNING METACARPAL JOINT ;  Surgeon: Cindee Salt, MD;  Location: [facility];  Service: Orthopedics;  Laterality: Right;   ULNAR COLLATERAL LIGAMENT REPAIR Right 07/28/2015   Procedure: RECONSTRUCTION RADIAL COLLATERAL LIGAMENT RIGHT THUMB;  Surgeon: Cindee Salt, MD;  Location: [facility];  Service: Orthopedics;  Laterality: Right;    VASECTOMY      FAMILY HISTORY: History reviewed. No pertinent family history.  SOCIAL HISTORY: Social History   Socioeconomic History   Marital status: Married    Spouse name: molly   Number of children: 2   Years of education: Not on file   Highest education level: Some college, no degree  Occupational History   Not on file  Tobacco Use   Smoking status: Former   Smokeless tobacco: Former    Types: Chew  Substance and Sexual Activity   Alcohol use: Yes    Alcohol/week: 3.0 standard drinks of alcohol    Types: 3 Standard drinks or equivalent per week    Comment: occas   Drug use: Yes    Types: Marijuana    Comment: used in oct   Sexual activity: Yes    Birth control/protection: Surgical  Other Topics Concern   Not on file  Social History Narrative   Not on file   Social Drivers  of Health   Financial Resource Strain: Not on file  Food Insecurity: Not on file  Transportation Needs: Not on file  Physical Activity: Not on file  Stress: Not on file  Social Connections: Not on file  Intimate Partner Violence: Not on file      Levert Feinstein, M.D. Ph.D.  [facility] [address], [address] [place], [place] [address] Ph: [phone] Fax: [phone]  CC:  Olene Floss, PA-C [address] [place],  [place] [address]  Borum, [place] Mink, MD

## 2023-11-08 ENCOUNTER — Emergency Department (HOSPITAL_BASED_OUTPATIENT_CLINIC_OR_DEPARTMENT_OTHER)
Admission: EM | Admit: 2023-11-08 | Discharge: 2023-11-08 | Disposition: A | Discharge status: Home / Self Care | Payer: Non-veteran care | Attending: Emergency Medicine | Admitting: Emergency Medicine

## 2023-11-08 ENCOUNTER — Encounter: Payer: Self-pay | Admitting: Neurology

## 2023-11-08 ENCOUNTER — Other Ambulatory Visit: Payer: Self-pay | Admitting: Neurology

## 2023-11-08 ENCOUNTER — Telehealth: Payer: Self-pay | Admitting: Neurology

## 2023-11-08 DIAGNOSIS — G500 Trigeminal neuralgia: Secondary | ICD-10-CM

## 2023-11-08 LAB — COMPREHENSIVE METABOLIC PANEL
ALT: 44 [IU]/L (ref 0–44)
AST: 28 [IU]/L (ref 0–40)
Albumin: 4.7 g/dL (ref 4.1–5.1)
Alkaline Phosphatase: 62 [IU]/L (ref 44–121)
BUN/Creatinine Ratio: 10 (ref 9–20)
BUN: 10 mg/dL (ref 6–20)
Bilirubin Total: 0.3 mg/dL (ref 0.0–1.2)
CO2: 21 mmol/L (ref 20–29)
Calcium: 9.1 mg/dL (ref 8.7–10.2)
Chloride: 103 mmol/L (ref 96–106)
Creatinine, Ser: 1.01 mg/dL (ref 0.76–1.27)
Globulin, Total: 2.7 g/dL (ref 1.5–4.5)
Glucose: 84 mg/dL (ref 70–99)
Potassium: 4.1 mmol/L (ref 3.5–5.2)
Sodium: 141 mmol/L (ref 134–144)
Total Protein: 7.4 g/dL (ref 6.0–8.5)
eGFR: 97 mL/min/{1.73_m2} (ref >59)

## 2023-11-08 LAB — CARBAMAZEPINE LEVEL, TOTAL: Carbamazepine (Tegretol), S: 12.9 ug/mL (ref 4.0–12.0)

## 2023-11-08 LAB — PHENYTOIN LEVEL, TOTAL: Phenytoin Lvl: 16.3 ug/mL (ref 10.0–20.0)

## 2023-11-08 MED ORDER — SODIUM CHLORIDE 0.9 % IV SOLN
1000.0000 mg | Freq: Once | INTRAVENOUS | Status: AC
  Administered 2023-11-08: 1000 mg via INTRAVENOUS
  Filled 2023-11-08: qty 20

## 2023-11-08 MED ORDER — PROMETHAZINE HCL 25 MG PO TABS: 25.0000 mg | ORAL_TABLET | Freq: Four times a day (QID) | ORAL | 0 refills | Status: DC | PRN

## 2023-11-08 MED ORDER — LACTATED RINGERS IV BOLUS
1000.0000 mL | Freq: Once | INTRAVENOUS | Status: AC
  Administered 2023-11-08: 1000 mL via INTRAVENOUS

## 2023-11-08 MED ORDER — ONDANSETRON HCL 4 MG/2ML IJ SOLN
4.0000 mg | Freq: Once | INTRAMUSCULAR | Status: AC
  Administered 2023-11-08: 4 mg via INTRAVENOUS
  Filled 2023-11-08: qty 2

## 2023-11-08 MED ORDER — GABAPENTIN 300 MG PO CAPS
300.0000 mg | ORAL_CAPSULE | Freq: Once | ORAL | Status: DC
  Filled 2023-11-08 (×2): qty 1

## 2023-11-08 MED ORDER — PROMETHAZINE HCL 25 MG PO TABS
25.0000 mg | ORAL_TABLET | Freq: Once | ORAL | Status: AC
  Administered 2023-11-08: 25 mg via ORAL
  Filled 2023-11-08: qty 1

## 2023-11-08 MED ORDER — DIAZEPAM 5 MG PO TABS
5.0000 mg | ORAL_TABLET | Freq: Once | ORAL | Status: AC
  Administered 2023-11-08: 5 mg via ORAL
  Filled 2023-11-08: qty 1

## 2023-11-08 MED ORDER — ONDANSETRON 4 MG PO TBDP
ORAL_TABLET | ORAL | Status: AC
  Administered 2023-11-08: 4 mg via ORAL
  Filled 2023-11-08: qty 1

## 2023-11-08 MED ORDER — ONDANSETRON 4 MG PO TBDP: 4.0000 mg | ORAL_TABLET | Freq: Once | ORAL | Status: AC

## 2023-11-08 MED ORDER — MECLIZINE HCL 25 MG PO TABS
50.0000 mg | ORAL_TABLET | Freq: Once | ORAL | Status: AC
  Administered 2023-11-08: 50 mg via ORAL
  Filled 2023-11-08: qty 2

## 2023-11-08 MED ORDER — ONDANSETRON HCL 4 MG/2ML IJ SOLN
4.0000 mg | Freq: Once | INTRAMUSCULAR | Status: DC
  Filled 2023-11-08: qty 2

## 2023-11-08 MED ORDER — TRAMADOL HCL 50 MG PO TABS: 50.0000 mg | ORAL_TABLET | Freq: Four times a day (QID) | ORAL | 1 refills | Status: DC | PRN

## 2023-11-08 MED ORDER — HYDROMORPHONE HCL 1 MG/ML IJ SOLN
1.0000 mg | Freq: Once | INTRAMUSCULAR | Status: AC
  Administered 2023-11-08: 1 mg via INTRAVENOUS
  Filled 2023-11-08: qty 1

## 2023-11-08 MED ORDER — SODIUM CHLORIDE 0.9 % IV SOLN: 1000.0000 mg | Freq: Once | INTRAVENOUS | Status: DC

## 2023-11-08 MED ORDER — PHENYTOIN SODIUM 50 MG/ML IJ SOLN
INTRAMUSCULAR | Status: AC
  Administered 2023-11-08: 1000 mg
  Filled 2023-11-08: qty 20

## 2023-11-08 NOTE — ED Notes (Signed)
AVS provided by edp was reviewed with pt. Pt verbalized understanding with no additional questions at this time.   Pt remains in room. Is currently working on getting ride home.

## 2023-11-08 NOTE — Discharge Instructions (Signed)
Fill the prescriptions that were sent by neurology today.  Continue gabapentin, carbamazepine, Solu-Medrol.  Return to the ED with difficulty swallowing, difficulty breathing, chest pain, shortness of breath, other concerns.

## 2023-11-08 NOTE — ED Notes (Signed)
Pt offered fluids for po challenge

## 2023-11-08 NOTE — ED Provider Notes (Signed)
7:58 AM I was asked to see patient by the nurse.  He has been up for discharge for multiple hours.  He tells me that he is experiencing a lot of dizziness and a hard time focusing.  He is had this before as a side effect of medication such as Neurontin.  The nausea seems to be coming from the dizziness.  Will give some Antivert and see if this helps, he is otherwise stating that his other primary complaint of brought him to the ER is now resolved.  9:30 AM Patient was re-evaluated and still feels dizzy.  Whenever he tries to sit up he gets significantly dizzy like things are narrowing and his eyes are crossing.  He gets very nauseated.  Will try some Valium and Phenergan.  The patient was offered MRI given his vertiginous symptoms though he declines.  I do suspect this is probably peripheral and/or medication related.  Will try these meds and reassess.  Patient is eager to go home but just needs to be able to stand up and transfer without getting so symptomatic.  11:01 AM Patient feels better and wants to go. Will send Rx for phenergan in case he develops recurrent nausea.    Pricilla Loveless, MD 11/08/23 1101

## 2023-11-08 NOTE — ED Notes (Signed)
Upon meeting pt, he c/o persistent dizziness w/ nausea any time he moved or opened his eyes. IV had already been removed in prep for DC so ODT Zofran was given. Pt continued vomiting, MD was made aware. Pt placed back in bed and given meds per MAR. Will continue to monitor

## 2023-11-08 NOTE — ED Notes (Signed)
Pt states he is feeling better after further evaluation and treatment. MD notified.

## 2023-11-08 NOTE — ED Notes (Signed)
Pt brought back to triage for repeat VS. Pt updated that staff is unable to give an estimated wait time. Denies any change in s/s.

## 2023-11-08 NOTE — Telephone Encounter (Signed)
Patient still having intense pain.  Went to ED and received fosphenytoin and dilaudid and improved but pain is returning.   On carbamazepine and prgabalin.   I sent in a script for #30 tramadol up to qid prn

## 2023-11-08 NOTE — ED Provider Notes (Signed)
[place] EMERGENCY DEPARTMENT AT [facility] Provider Note   CSN: [id number] Arrival date & time: 11/07/23  1813     History  Chief Complaint  Patient presents with   Facial Pain    [name] [name] is a 38 y.o. male.  Patient returns with severe facial pain.  He was seen for this on February 5 and diagnosed with trigeminal neuralgia.  He follow-up with the neurologist today and feels this pain is much worse since his exam at the neurology office.  He has been taking carbamazepine 400 mg every 8 hours as well as methylprednisolone which she got his first dose today.  He was also prescribed Lyrica and Valtrex and gabapentin by the neurologist has not yet filled these.  He states he feels like he is dehydrated because he has severe pain to his face when he swallows.  Most of the pain is to his right forehead, right cheek and right jaw area.  Denies any visual changes.  Denies any weakness, numbness or tingling.  No fever.  No chest pain or shortness of breath.  No nausea or vomiting.  Pain is similar to when he was seen in the ED on 2/5.  He was prescribed Valtrex and methylprednisolone today by the neurologist with some concern for possible shingles though he has no rash and he has no visual symptoms.  He did not feel the medications today other than the methylprednisolone.  He was told to increase his Tegretol to 400 mg 4 times daily.  The history is provided by the patient.       Home Medications Prior to Admission medications   Medication Sig Start Date End Date Taking? Authorizing Provider  adalimumab (HUMIRA, 2 PEN,) 40 MG/0.8ML AJKT pen Take 40 mg by mouth every 14 (fourteen) days. 08/11/19   [provider]  carbamazepine (TEGRETOL-XR) 200 MG 12 hr tablet Take 2 tablets (400 mg total) by mouth 4 (four) times daily. 11/07/23   Levert Feinstein, MD  gabapentin (NEURONTIN) 300 MG capsule Take 3 capsules (900 mg total) by mouth 3 (three) times daily. 11/07/23   Levert Feinstein, MD   Lidocaine HCl 4 % SOLN NASAL SPRAY TID AS NEEDED. 11/07/23   Levert Feinstein, MD  LORazepam (ATIVAN) 1 MG tablet Take 1 tablet (1 mg total) by mouth every 8 (eight) hours as needed for anxiety. 10/31/23   Prosperi, Christian H, PA-C  meclizine (ANTIVERT) 25 MG tablet Take 1 tablet (25 mg total) by mouth 3 (three) times daily as needed for dizziness. 04/24/22   Linwood Dibbles, MD  methylPREDNISolone (MEDROL DOSEPAK) 4 MG TBPK tablet Take as instructed 11/07/23   Levert Feinstein, MD  pregabalin (LYRICA) 100 MG capsule Take 1 capsule (100 mg total) by mouth 3 (three) times daily. 11/07/23   Levert Feinstein, MD  valACYclovir (VALTREX) 1000 MG tablet Take 1 tablet (1,000 mg total) by mouth 3 (three) times daily. 11/07/23   Levert Feinstein, MD      Allergies    Penicillins, Oxycodone, Amoxicillin, Humira (2 pen) [adalimumab], Sulfa antibiotics, and Sulfacetamide sodium    Review of Systems   Review of Systems  Constitutional:  Negative for activity change, appetite change and fever.  HENT:  Negative for congestion and rhinorrhea.   Respiratory:  Negative for cough, chest tightness and shortness of breath.   Cardiovascular:  Negative for chest pain.  Gastrointestinal:  Negative for abdominal pain, nausea and vomiting.  Genitourinary:  Negative for dysuria and hematuria.  Musculoskeletal:  Negative for  arthralgias and myalgias.  Skin:  Negative for rash.  Neurological:  Positive for headaches. Negative for weakness.   all other systems are negative except as noted in the HPI and PMH.    Physical Exam Updated Vital Signs BP (!) 135/91 (BP Location: Right Arm)   Pulse 80   Temp 97.7 F (36.5 C) (Oral)   Resp 18   Ht 5\' 9"  (1.753 m)   Wt 88.5 kg   SpO2 97%   BMI 28.80 kg/m  Physical Exam Vitals and nursing note reviewed.  Constitutional:      General: He is not in acute distress.    Appearance: He is well-developed.  HENT:     Head: Normocephalic and atraumatic.     Mouth/Throat:     Pharynx: No oropharyngeal  exudate.     Comments: Exquisite tenderness to palpation of right forehead, right cheek and right jaw.  No facial asymmetry.  Tongue is midline. Eyes:     Conjunctiva/sclera: Conjunctivae normal.     Pupils: Pupils are equal, round, and reactive to light.     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 Cardiovascular:     Rate and Rhythm: Normal rate and regular rhythm.     Heart sounds: Normal heart sounds. No murmur heard. Pulmonary:     Effort: Pulmonary effort is normal. No respiratory distress.     Breath sounds: Normal breath sounds.  Abdominal:     Palpations: Abdomen is soft.     Tenderness: There is no abdominal tenderness. There is no guarding or rebound.  Musculoskeletal:        General: No tenderness. Normal range of motion.     Cervical back: Normal range of motion and neck supple.  Skin:    General: Skin is warm.  Neurological:     Mental Status: He is alert and oriented to person, place, and time.     Cranial Nerves: No cranial nerve deficit.     Motor: No abnormal muscle tone.     Coordination: Coordination normal.     Comments:  5/5 strength throughout. CN 2-12 intact.Equal grip strength.   Psychiatric:        Behavior: Behavior normal.     ED Results / Procedures / Treatments   Labs (all labs ordered are listed, but only abnormal results are displayed) Labs Reviewed  CBC WITH DIFFERENTIAL/PLATELET - Abnormal; Notable for the following components:      Result Value   MCHC 36.2 (*)    All other components within normal limits  COMPREHENSIVE METABOLIC PANEL  PHENYTOIN LEVEL, TOTAL    EKG None  Radiology No results found.  Procedures Procedures    Medications Ordered in ED Medications  HYDROmorphone (DILAUDID) injection 1 mg (has no administration in time range)  lactated ringers bolus 1,000 mL (has no administration in time range)    ED Course/ Medical Decision Making/ A&P                                  Medical Decision Making Amount and/or Complexity of Data Reviewed Labs: ordered. Decision-making details documented in ED Course. Radiology: ordered and independent interpretation performed. Decision-making details documented in ED Course. ECG/medicine tests: ordered and independent interpretation performed. Decision-making details documented in ED Course.  Risk Prescription drug management.   Worsening facial pain since  February 5.  Diagnosis of trigeminal neuralgia by neurology.  Vitals are stable.  No distress.  No neurological deficits.  No rash.  MRI did not show any structural abnormalities. Labs today unremarkable.   D/w Dr. Amada Jupiter of neurology.  He agrees patient is on proper treatment of carbamazepine and gabapentin.  He states could try repeat fosphenytoin loading dose or IV lidocaine if pain is refractory.  Patient states that fosphenytoin did help him during his previous ED visit.  Patient agreeable to phenytoin as this helped him previously.  Feels much improved after phenytoin.  Tolerating p.o.  Pain is improved.  Encouraged him to fill his medications that were prescribed by neurology earlier today.  He states that should be available at the pharmacy tomorrow.  He is able to swallow his medications and tolerate p.o. he declines hospital admission.  Discussed close follow-up with neurology as well as PCP.  Return to the ED with new or worsening symptoms.        Final Clinical Impression(s) / ED Diagnoses Final diagnoses:  Trigeminal neuralgia    Rx / DC Orders ED Discharge Orders     None         Hailley Byers, Jeannett Senior, MD 11/08/23 [id number]

## 2023-11-10 ENCOUNTER — Other Ambulatory Visit: Payer: Self-pay

## 2023-11-10 ENCOUNTER — Emergency Department (HOSPITAL_BASED_OUTPATIENT_CLINIC_OR_DEPARTMENT_OTHER)
Admission: EM | Admit: 2023-11-10 | Discharge: 2023-11-10 | Disposition: A | Discharge status: Home / Self Care | Payer: No Typology Code available for payment source | Attending: Emergency Medicine | Admitting: Emergency Medicine

## 2023-11-10 DIAGNOSIS — G500 Trigeminal neuralgia: Secondary | ICD-10-CM | POA: Insufficient documentation

## 2023-11-10 DIAGNOSIS — R519 Headache, unspecified: Secondary | ICD-10-CM | POA: Diagnosis present

## 2023-11-10 MED ORDER — HYDROMORPHONE HCL 1 MG/ML IJ SOLN
1.0000 mg | Freq: Once | INTRAMUSCULAR | Status: AC
  Administered 2023-11-10: 1 mg via INTRAVENOUS
  Filled 2023-11-10: qty 1

## 2023-11-10 MED ORDER — ONDANSETRON HCL 4 MG/2ML IJ SOLN
4.0000 mg | Freq: Once | INTRAMUSCULAR | Status: AC
  Administered 2023-11-10: 4 mg via INTRAVENOUS
  Filled 2023-11-10: qty 2

## 2023-11-10 MED ORDER — OXYCODONE HCL 5 MG PO TABS
5.0000 mg | ORAL_TABLET | Freq: Once | ORAL | Status: AC
Start: 2023-11-10 — End: 2023-11-10
  Administered 2023-11-10: 5 mg via ORAL
  Filled 2023-11-10: qty 1

## 2023-11-10 MED ORDER — MELOXICAM 7.5 MG PO TABS: 7.5000 mg | ORAL_TABLET | Freq: Every day | ORAL | 0 refills | Status: DC

## 2023-11-10 MED ORDER — KETOROLAC TROMETHAMINE 15 MG/ML IJ SOLN
15.0000 mg | Freq: Once | INTRAMUSCULAR | Status: AC
Start: 2023-11-10 — End: 2023-11-10
  Administered 2023-11-10: 15 mg via INTRAVENOUS
  Filled 2023-11-10: qty 1

## 2023-11-10 MED ORDER — OXYCODONE HCL 5 MG PO TABS: 5.0000 mg | ORAL_TABLET | Freq: Four times a day (QID) | ORAL | 0 refills | Status: DC | PRN

## 2023-11-10 NOTE — ED Triage Notes (Signed)
 Pt reports continued RT side facial pain, recent tx for same.

## 2023-11-10 NOTE — ED Provider Notes (Signed)
 [place] EMERGENCY DEPARTMENT AT [facility] Provider Note   CSN: [id number] Arrival date & time: 11/10/23  1035     History  Chief Complaint  Patient presents with   Facial Pain    [name] [name] is a 38 y.o. male.  Patient presents for worsening/severe right-sided facial pain.  Patient has had symptoms and evaluations for this over the past 10 days.  Worse in the right cheek and forehead area.  No rashes.  Symptoms have not changed other than pain being more persistent and severe.  He has tried applying Lidoderm patch at home without improvement as well.  Recent history:  2/6: Initial presentation.  Treated with IV Dilantin, discharged home on Tegretol.  Neurology was consulted, recommended MRI brain which was performed and negative.  2/13: Followed up with Dr. Maple Hudson, [facility] neurology.  At that time Tegretol dose was increased, added Lyrica, Medrol Dosepak and Valtrex  2/14: Patient return to the emergency department for uncontrolled pain.  Was given another dose of phenytoin however this caused dizziness and delay discharge for several hours until this was resolved.  Patient was given Valium and Phenergan for this, eventually improved.  2/14: Called neurologist with persistent pain, they sent in tramadol.  Patient has tried this without improvement.      Home Medications Prior to Admission medications   Medication Sig Start Date End Date Taking? Authorizing Provider  adalimumab (HUMIRA, 2 PEN,) 40 MG/0.8ML AJKT pen Take 40 mg by mouth every 14 (fourteen) days. 08/11/19   [provider]  carbamazepine (TEGRETOL-XR) 200 MG 12 hr tablet Take 2 tablets (400 mg total) by mouth 4 (four) times daily. 11/07/23   Levert Feinstein, MD  gabapentin (NEURONTIN) 300 MG capsule Take 3 capsules (900 mg total) by mouth 3 (three) times daily. 11/07/23   Levert Feinstein, MD  Lidocaine HCl 4 % SOLN NASAL SPRAY TID AS NEEDED. 11/07/23   Levert Feinstein, MD  LORazepam (ATIVAN) 1 MG tablet Take  1 tablet (1 mg total) by mouth every 8 (eight) hours as needed for anxiety. 10/31/23   Prosperi, Christian H, PA-C  meclizine (ANTIVERT) 25 MG tablet Take 1 tablet (25 mg total) by mouth 3 (three) times daily as needed for dizziness. 04/24/22   Linwood Dibbles, MD  methylPREDNISolone (MEDROL DOSEPAK) 4 MG TBPK tablet Take as instructed 11/07/23   Levert Feinstein, MD  pregabalin (LYRICA) 100 MG capsule Take 1 capsule (100 mg total) by mouth 3 (three) times daily. 11/07/23   Levert Feinstein, MD  promethazine (PHENERGAN) 25 MG tablet Take 1 tablet (25 mg total) by mouth every 6 (six) hours as needed for nausea or vomiting. 11/08/23   Pricilla Loveless, MD  traMADol (ULTRAM) 50 MG tablet Take 1 tablet (50 mg total) by mouth every 6 (six) hours as needed. 11/08/23   Sater, Pearletha Furl, MD  valACYclovir (VALTREX) 1000 MG tablet Take 1 tablet (1,000 mg total) by mouth 3 (three) times daily. 11/07/23   Levert Feinstein, MD      Allergies    Penicillins, Gabapentin, Oxycodone, Amoxicillin, Humira (2 pen) [adalimumab], Sulfa antibiotics, and Sulfacetamide sodium    Review of Systems   Review of Systems  Physical Exam Updated Vital Signs BP (!) 130/104   Pulse 100   Temp 97.7 F (36.5 C)   Resp 18   SpO2 100%   Physical Exam Vitals and nursing note reviewed.  Constitutional:      General: He is in acute distress (very uncomfortable).  Appearance: He is well-developed.  HENT:     Head: Normocephalic and atraumatic.     Right Ear: External ear normal.     Left Ear: External ear normal.     Nose: Nose normal.     Mouth/Throat:     Mouth: Mucous membranes are moist.  Eyes:     General: No visual field deficit.    Conjunctiva/sclera: Conjunctivae normal.     Pupils: Pupils are equal, round, and reactive to light.  Pulmonary:     Effort: No respiratory distress.  Musculoskeletal:     Cervical back: Normal range of motion and neck supple.  Skin:    General: Skin is warm and dry.  Neurological:     Mental Status: He is  alert.     Cranial Nerves: No cranial nerve deficit, dysarthria or facial asymmetry.     Sensory: Sensory deficit present.     Coordination: Coordination is intact.     Comments: Patient reports severe pain with light touch of the upper cheek and forehead on the right.  He states that he has worse pain when his tongue touches the roof of his mouth.  He has worse pain with lateral gaze to the right.     ED Results / Procedures / Treatments   Labs (all labs ordered are listed, but only abnormal results are displayed) Labs Reviewed - No data to display  EKG None  Radiology No results found.  Procedures Procedures    Medications Ordered in ED Medications  HYDROmorphone (DILAUDID) injection 1 mg (1 mg Intravenous Given 11/10/23 1148)  ondansetron (ZOFRAN) injection 4 mg (4 mg Intravenous Given 11/10/23 1148)    ED Course/ Medical Decision Making/ A&P    Patient seen and examined. History obtained directly from patient.  Reviewed recent neurology notes, ED notes, treatments.  Labs/EKG: None ordered  Imaging: None ordered  Medications/Fluids: Ordered: IV hydromorphone 1 mg.   Most recent vital signs reviewed and are as follows: BP (!) 130/104   Pulse 100   Temp 97.7 F (36.5 C)   Resp 18   SpO2 100%   Initial impression: Intractable pain suspected to be due to to trigeminal neuralgia  1:09 PM Reassessment performed. Patient appears much more comfortable after IV Dilaudid.  He would like to try to eat and drink something.  Most current vital signs reviewed and are as follows: BP (!) 130/104   Pulse 100   Temp 97.7 F (36.5 C)   Resp 18   SpO2 100%   Plan: Will discuss with on-call neurology further recommendations.  5:27 PM Reassessment performed. Patient appears more comfortable now.  Pain control has been very difficult during stay.  He has received several doses of parenteral narcotics.  Last dose was helpful along with IV Toradol.  I did speak with Dr. Selina Cooley.   She recommended taper course of gabapentin, however patient is unable to tolerate this medication unfortunately.  No inpatient treatments available here for patient.   Reviewed pertinent lab work and imaging with patient at bedside. Questions answered.   Most current vital signs reviewed and are as follows: BP 135/83   Pulse 88   Temp 98.6 F (37 C) (Oral)   Resp 20   SpO2 96%   Plan: Discharge to home.  Pain is reasonably controlled at current.  Will plan to give #10 oxycodone 5 mg to use for breakthrough pain.  Also prescribed meloxicam to use daily.  Patient counseled on use of narcotic pain medications.  Counseled not to combine these medications with others containing tylenol. Urged not to drink alcohol, drive, or perform any other activities that requires focus while taking these medications. The patient verbalizes understanding and agrees with the plan.  Other home care instructions discussed: Continue medications recommended by neurology except for tramadol, if taking the oxycodone.  ED return instructions discussed: New or worsening symptoms  Follow-up instructions discussed: Patient encouraged to call neurology tomorrow for further recommendations.                                Medical Decision Making Risk Prescription drug management.   Patient with fairly intractable right-sided facial pain, thought due to trigeminal neuralgia.  Symptoms unchanged, just unresponsive to home medications.  Did get some improvement with IV narcotics and Toradol here.  Discussed again with neurology.  Patient is currently on maximal medical therapy, at least as far as we can tolerate.  Pain was controlled as best as possible here.  Patient does look more comfortable now.  He is high risk for relapse and recurrent visit.  This is a very difficult situation with minimal improvement with medical therapy at this point.        Final Clinical Impression(s) / ED Diagnoses Final diagnoses:   Trigeminal neuralgia of right side of face    Rx / DC Orders ED Discharge Orders          Ordered    oxyCODONE (OXY IR/ROXICODONE) 5 MG immediate release tablet  Every 6 hours PRN        11/10/23 1725    meloxicam (MOBIC) 7.5 MG tablet  Daily        11/10/23 1725              Renne Crigler, PA-C 11/10/23 1733    Derwood Kaplan, MD 11/11/23 1326

## 2023-11-10 NOTE — Discharge Instructions (Signed)
 Please read and follow all provided instructions.  Your diagnoses today include:  1. Trigeminal neuralgia of right side of face    Tests performed today include: Vital signs. See below for your results today.   Medications prescribed:  Oxycodone - narcotic pain medication  DO NOT drive or perform any activities that require you to be awake and alert because this medicine can make you drowsy.   Meloxicam - anti-inflammatory pain medication  You have been prescribed an anti-inflammatory medication or NSAID. Take with food. Do not take aspirin, ibuprofen, or naproxen if taking this medication. Take smallest effective dose for the shortest duration needed for your pain. Stop taking if you experience stomach pain or vomiting.   Take any prescribed medications only as directed.  Home care instructions:  Follow any educational materials contained in this packet.  BE VERY CAREFUL not to take multiple medicines containing Tylenol (also called acetaminophen). Doing so can lead to an overdose which can damage your liver and cause liver failure and possibly death.   Follow-up instructions: Please follow-up with your neurologist tomorrow for further recommendations.    Return instructions:  Please return to the Emergency Department if you experience worsening symptoms.  Please return if you have any other emergent concerns.  Additional Information:  Your vital signs today were: BP 135/83   Pulse 88   Temp 98.6 F (37 C) (Oral)   Resp 20   SpO2 96%  If your blood pressure (BP) was elevated above 135/85 this visit, please have this repeated by your doctor within one month. --------------

## 2023-11-11 ENCOUNTER — Telehealth: Payer: Self-pay | Admitting: Neurology

## 2023-11-11 ENCOUNTER — Telehealth: Payer: Self-pay

## 2023-11-11 MED ORDER — PREGABALIN 200 MG PO CAPS: 200.0000 mg | ORAL_CAPSULE | Freq: Three times a day (TID) | ORAL | 5 refills | Status: DC

## 2023-11-11 NOTE — Telephone Encounter (Signed)
 Call to patient, he states he just spoke with Dr. Terrace Arabia and she advised she would make referral to [facility]. He stated she increased his Lyrica and advised to continue medications for ER visit.

## 2023-11-11 NOTE — Addendum Note (Signed)
 Addended by: Levert Feinstein on: 11/11/2023 09:47 AM   Modules accepted: Orders

## 2023-11-11 NOTE — Telephone Encounter (Signed)
 Referral for neurosurgery fax to [facility] to see Dr. Jeannett Senior B. Tatter. Phone: [phone], Fax: [phone]

## 2023-11-11 NOTE — Telephone Encounter (Signed)
 I called patient, he still have significant facial pain taking tegretol xr 200mg  2 tab qid, lyrica 100mg  tid, finishing up his Valtrex, prednisone tapering  Presented to emergency room twice over the weekend due to severe right facial pain, overall tolerating medication, went to work today  Lab from November 07, 2023, Tegretol level 12.9, normal CMP, CBC  Will refer him to [facility] neurosurgeon Dr. Brooke Pace for evaluation Keep current dose of Tegretol, Higher dose of Lyrica 200 mg 3 times a day

## 2023-11-13 ENCOUNTER — Telehealth: Payer: No Typology Code available for payment source | Admitting: Neurology

## 2023-11-13 DIAGNOSIS — Z79899 Other long term (current) drug therapy: Secondary | ICD-10-CM | POA: Diagnosis not present

## 2023-11-13 DIAGNOSIS — G500 Trigeminal neuralgia: Secondary | ICD-10-CM | POA: Diagnosis not present

## 2023-11-13 MED ORDER — OXCARBAZEPINE 150 MG PO TABS: 300.0000 mg | ORAL_TABLET | Freq: Three times a day (TID) | ORAL | 11 refills | Status: DC

## 2023-11-13 MED ORDER — OXCARBAZEPINE 150 MG PO TABS: 300.0000 mg | ORAL_TABLET | Freq: Three times a day (TID) | ORAL | 11 refills | Status: AC

## 2023-11-13 MED ORDER — CELECOXIB 100 MG PO CAPS: 100.0000 mg | ORAL_CAPSULE | Freq: Two times a day (BID) | ORAL | 3 refills | Status: AC | PRN

## 2023-11-13 MED ORDER — OXYCODONE-ACETAMINOPHEN 10-325 MG PO TABS: 1.0000 | ORAL_TABLET | Freq: Three times a day (TID) | ORAL | 0 refills | Status: DC | PRN

## 2023-11-13 NOTE — Progress Notes (Signed)
 ASSESSMENT AND PLAN  [name] [name] is a 38 y.o. 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, clinical (histocompatibility and immunogenetics), and Tegretol XR up to 200 mg 2 tablets 4 times a day, Lyrica 100 mg 3 times a day, presented to emergency room twice over the past few days, received the Dilaudid, which has been helpful, was also given prescription of oxycodone, that was helpful  Will try Trileptal 150 mg 2 tablets 3 times a day, higher dose of Lyrica 200 mg 3 times a day,  Percocet as needed, I also put in the referral to [facility] with his reported severe pain    DIAGNOSTIC DATA (LABS, IMAGING, TESTING) - I reviewed patient records, labs, notes, testing and imaging myself where available.   MEDICAL HISTORY:  Tron Flythe is a 38 year old male, seen in request by his primary care from [facility] Dr. Jess Barters, [place] Mink, for evaluation of right trigeminal pain, initial evaluation November 07, 2023  History is obtained from the patient and review of electronic medical records. I personally reviewed pertinent available imaging films in PACS.   PMHx of  Anxiety Rheumatoid arthritis Psoriatic arthritis GERD Left ulnar fracture  He went to bed with normal status on February 5, woke up early morning February 6 around 2 AM, noticed significant right facial pain, sheets touching his right face will trigger significant shooting pain, involving right forehead, upper and lower eyelid, cheek area, right upper tooth  Had a persistent sharp radiating pain since his onset, denies rash broke out, skin sensitive to touch, could not truly swallow, only eat  3 meals over past 1 week,  Presented to emergency room had MRI of the brain with without contrast October 31, 2023 that was normal Normal BMP CBC  He was given the prescription of Tegretol, 200 mg taking up to 2 tablets 3 times a day, still have significant pain, tolerate the medication well  He took gabapentin for his low back pain in the past, could not tolerated due to side effect dizziness,   Virtual Visit via video Nov 13 2023 I discussed the limitations of evaluation and management by telemedicine and the availability of in person appointments. The patient expressed understanding and agreed to proceed  Location: Provider: [facility] office; Patient: Home  I connected with Madex Seals  on Nov 13 2023 by a video enabled telemedicine application and verified that I am speaking with the correct person using two identifiers.  UPDATED HiSTORY He still have significant right facial pain, more limited to the top of right eye, run down to the right nose, cheek area becoming less sensitive, but he still cannot eat regular food, can only eat soft food,  He presented to emergency room twice, February 13 and 16, received Dilaudid, reported has been helpful, also got Dilantin, Toradol, meclizine, complains significant dizziness with those medication treatment  1 hour after taking Lyrica 200 mg, Tegretol 400 mg, he can eat something, then his right facial pain would recur  He did report a history of narcotic dependence few years ago for his low back pain, he was able to wean off successfully, also  know friends that suffered narcotic dependence,    Observations/Objective: I have reviewed problem lists, medications, allergies. Awake, alert in no acute distress, oriented to history taking and casual conversation,   REVIEW OF SYSTEMS:  Full 14 system review of systems performed and notable only for as above All other review of systems were negative.   ALLERGIES: Allergies  Allergen Reactions    Penicillins Shortness Of Breath    Other Reaction(s): Airway constriction, Pharyngeal swelling, Throat swelling, Unknown   Gabapentin Nausea And Vomiting and Other (See Comments)    Pt states he wasn't able to move his head left or right for a few hours, had nausea and emesis   Oxycodone Itching   Amoxicillin Rash   Humira (2 Pen) [Adalimumab] Rash    Still taking   Sulfa Antibiotics Rash    Other Reaction(s): Unknown   Sulfacetamide Sodium Rash    HOME MEDICATIONS: Current Outpatient Medications  Medication Sig Dispense Refill   adalimumab (HUMIRA, 2 PEN,) 40 MG/0.8ML AJKT pen Take 40 mg by mouth every 14 (fourteen) days.     carbamazepine (TEGRETOL-XR) 200 MG 12 hr tablet Take 2 tablets (400 mg total) by mouth 4 (four) times daily. 240 tablet 6   Lidocaine HCl 4 % SOLN NASAL SPRAY TID AS NEEDED. 100 mL 3   LORazepam (ATIVAN) 1 MG tablet Take 1 tablet (1 mg total) by mouth every 8 (eight) hours as needed for anxiety. 8 tablet 0   meclizine (ANTIVERT) 25 MG tablet Take 1 tablet (25 mg total) by mouth 3 (three) times daily as needed for dizziness. 30 tablet 0   meloxicam (MOBIC) 7.5 MG tablet Take 1 tablet (7.5 mg total) by mouth daily. 10 tablet 0   methylPREDNISolone (MEDROL DOSEPAK) 4 MG TBPK tablet Take as instructed 21 tablet 0   oxyCODONE (OXY IR/ROXICODONE) 5 MG immediate release tablet Take 1 tablet (5 mg total) by mouth every 6 (six) hours as needed for severe pain (pain score 7-10). 10 tablet 0   pregabalin (LYRICA) 200 MG capsule Take 1 capsule (200 mg total) by mouth 3 (three) times daily. 90 capsule 5   promethazine (PHENERGAN) 25 MG tablet Take 1 tablet (25 mg total) by mouth every 6 (six) hours as needed for nausea or vomiting. 10 tablet 0   valACYclovir (VALTREX) 1000 MG tablet Take 1 tablet (1,000 mg total) by mouth 3 (three) times daily. 21 tablet 0   No current facility-administered medications for this visit.    PAST MEDICAL HISTORY: Past Medical History:   Diagnosis Date   Anxiety    Arthritis    Bulging lumbar disc    Depression    GERD (gastroesophageal reflux disease)    Psoriasis    Sleep apnea    does not use     PAST SURGICAL HISTORY: Past Surgical History:  Procedure Laterality Date   HAND SURGERY Right 09/24/2012   PERCUTANEOUS PINNING Right 07/28/2015   Procedure: PALMARIS LONGUS GRAFT, PINNING METACARPAL JOINT ;  Surgeon: Cindee Salt, MD;  Location: [facility];  Service: Orthopedics;  Laterality: Right;   ULNAR COLLATERAL LIGAMENT REPAIR Right 07/28/2015   Procedure: RECONSTRUCTION RADIAL COLLATERAL LIGAMENT RIGHT THUMB;  Surgeon: Cindee Salt, MD;  Location: [facility];  Service: Orthopedics;  Laterality: Right;   VASECTOMY      FAMILY HISTORY: No family history on file.  SOCIAL HISTORY: Social History   Socioeconomic History   Marital status: Married    Spouse name: molly  Number of children: 2   Years of education: Not on file   Highest education level: Some college, no degree  Occupational History   Not on file  Tobacco Use   Smoking status: Former   Smokeless tobacco: Former    Types: Chew  Substance and Sexual Activity   Alcohol use: Yes    Alcohol/week: 3.0 standard drinks of alcohol    Types: 3 Standard drinks or equivalent per week    Comment: occas   Drug use: Yes    Types: Marijuana    Comment: used in oct   Sexual activity: Yes    Birth control/protection: Surgical  Other Topics Concern   Not on file  Social History Narrative   Not on file   Social Drivers of Health   Financial Resource Strain: Not on file  Food Insecurity: Not on file  Transportation Needs: Not on file  Physical Activity: Not on file  Stress: Not on file  Social Connections: Not on file  Intimate Partner Violence: Not on file      Levert Feinstein, M.D. Ph.D.  [facility] [address], [address] [place], [place] [address] Ph: [phone] Fax: [phone]  CC:   Sherwood Gambler, MD [phone] [facility] [place],  [place] [address]  Borum, Vista Mink, MD

## 2023-11-14 ENCOUNTER — Telehealth: Payer: Self-pay | Admitting: Neurology

## 2023-11-14 NOTE — Telephone Encounter (Signed)
 6-10 week f/u scheduled with MD, no NP availability for 5+ months.

## 2023-11-15 ENCOUNTER — Encounter (HOSPITAL_BASED_OUTPATIENT_CLINIC_OR_DEPARTMENT_OTHER): Payer: Self-pay

## 2023-11-15 ENCOUNTER — Telehealth: Payer: Self-pay | Admitting: Neurology

## 2023-11-15 ENCOUNTER — Other Ambulatory Visit: Payer: Self-pay

## 2023-11-15 DIAGNOSIS — G500 Trigeminal neuralgia: Secondary | ICD-10-CM | POA: Insufficient documentation

## 2023-11-15 DIAGNOSIS — R519 Headache, unspecified: Secondary | ICD-10-CM | POA: Diagnosis present

## 2023-11-15 DIAGNOSIS — F419 Anxiety disorder, unspecified: Secondary | ICD-10-CM | POA: Diagnosis not present

## 2023-11-15 DIAGNOSIS — Z79899 Other long term (current) drug therapy: Secondary | ICD-10-CM | POA: Insufficient documentation

## 2023-11-15 NOTE — Telephone Encounter (Signed)
 Patient called on call for severe right facial pain despite polypharmacy treatment, including oxycodone, lyrica, tegretol   Suggest him go to ER for severe right facial pain.  Please check on his neurosurgeon refer to [facility].

## 2023-11-15 NOTE — ED Triage Notes (Signed)
 Pt reports continued RT side facial pain, recent tx for same. Pt reports he called the on call neurologist.

## 2023-11-16 ENCOUNTER — Emergency Department (HOSPITAL_BASED_OUTPATIENT_CLINIC_OR_DEPARTMENT_OTHER)
Admission: EM | Admit: 2023-11-16 | Discharge: 2023-11-16 | Disposition: A | Discharge status: Home / Self Care | Payer: No Typology Code available for payment source | Attending: Emergency Medicine | Admitting: Emergency Medicine

## 2023-11-16 ENCOUNTER — Encounter: Payer: Self-pay | Admitting: Neurology

## 2023-11-16 DIAGNOSIS — G500 Trigeminal neuralgia: Secondary | ICD-10-CM

## 2023-11-16 MED ORDER — KETOROLAC TROMETHAMINE 30 MG/ML IJ SOLN
30.0000 mg | Freq: Once | INTRAMUSCULAR | Status: AC
  Administered 2023-11-16: 30 mg via INTRAMUSCULAR
  Filled 2023-11-16: qty 1

## 2023-11-16 MED ORDER — KETAMINE HCL 10 MG/ML IJ SOLN
0.3000 mg/kg | Freq: Once | INTRAMUSCULAR | Status: AC
  Administered 2023-11-16: 27 mg via INTRAVENOUS
  Filled 2023-11-16: qty 1

## 2023-11-16 MED ORDER — DEXAMETHASONE SODIUM PHOSPHATE 10 MG/ML IJ SOLN
10.0000 mg | Freq: Once | INTRAMUSCULAR | Status: AC
  Administered 2023-11-16: 10 mg via INTRAMUSCULAR
  Filled 2023-11-16: qty 1

## 2023-11-16 MED ORDER — OXYCODONE HCL 5 MG PO TABS
10.0000 mg | ORAL_TABLET | Freq: Once | ORAL | Status: AC
  Administered 2023-11-16: 10 mg via ORAL
  Filled 2023-11-16: qty 2

## 2023-11-16 MED ORDER — HYDROMORPHONE HCL 1 MG/ML IJ SOLN
1.0000 mg | Freq: Once | INTRAMUSCULAR | Status: AC
  Administered 2023-11-16: 1 mg via INTRAMUSCULAR
  Filled 2023-11-16: qty 1

## 2023-11-16 MED ORDER — ONDANSETRON HCL 4 MG/2ML IJ SOLN
4.0000 mg | Freq: Once | INTRAMUSCULAR | Status: AC
  Administered 2023-11-16: 4 mg via INTRAVENOUS
  Filled 2023-11-16: qty 2

## 2023-11-16 NOTE — ED Provider Notes (Signed)
 [place] EMERGENCY DEPARTMENT AT [facility] Provider Note   CSN: [id number] Arrival date & time: 11/15/23  2205     History  Chief Complaint  Patient presents with   Facial Pain    Alonso Gapinski is a 38 y.o. male.  HPI     This is a 38 year old male who presents with ongoing and worsening right facial pain.  Patient reports he has burning sharp pain on the right side of his face.  He states he was diagnosed with trigeminal neuralgia and since that time he has not been without pain relief.  He has tried multiple medications including Tegretol, Celebrex, lidocaine, Medrol Dosepak, Trileptal, hydrocodone, Lyrica.  He is essentially had refractory pain despite following up with neurology.  He has been referred to neurosurgery.  Patient states that he cannot drink anything without pain shooting through the roof of his mouth.  Home Medications Prior to Admission medications   Medication Sig Start Date End Date Taking? Authorizing Provider  adalimumab (HUMIRA, 2 PEN,) 40 MG/0.8ML AJKT pen Take 40 mg by mouth every 14 (fourteen) days. 08/11/19   [provider]  carbamazepine (TEGRETOL-XR) 200 MG 12 hr tablet Take 2 tablets (400 mg total) by mouth 4 (four) times daily. 11/07/23   Levert Feinstein, MD  celecoxib (CELEBREX) 100 MG capsule Take 1 capsule (100 mg total) by mouth 2 (two) times daily as needed. 11/13/23   Levert Feinstein, MD  Lidocaine HCl 4 % SOLN NASAL SPRAY TID AS NEEDED. 11/07/23   Levert Feinstein, MD  LORazepam (ATIVAN) 1 MG tablet Take 1 tablet (1 mg total) by mouth every 8 (eight) hours as needed for anxiety. 10/31/23   Prosperi, Christian H, PA-C  meclizine (ANTIVERT) 25 MG tablet Take 1 tablet (25 mg total) by mouth 3 (three) times daily as needed for dizziness. 04/24/22   Linwood Dibbles, MD  meloxicam (MOBIC) 7.5 MG tablet Take 1 tablet (7.5 mg total) by mouth daily. 11/10/23   Renne Crigler, PA-C  methylPREDNISolone (MEDROL DOSEPAK) 4 MG TBPK tablet Take as instructed 11/07/23    Levert Feinstein, MD  OXcarbazepine (TRILEPTAL) 150 MG tablet Take 2 tablets (300 mg total) by mouth 3 (three) times daily. 11/13/23   Levert Feinstein, MD  oxyCODONE (OXY IR/ROXICODONE) 5 MG immediate release tablet Take 1 tablet (5 mg total) by mouth every 6 (six) hours as needed for severe pain (pain score 7-10). 11/10/23   Renne Crigler, PA-C  oxyCODONE-acetaminophen (PERCOCET) 10-325 MG tablet Take 1 tablet by mouth every 8 (eight) hours as needed for pain. 11/13/23   Levert Feinstein, MD  pregabalin (LYRICA) 200 MG capsule Take 1 capsule (200 mg total) by mouth 3 (three) times daily. 11/11/23   Levert Feinstein, MD  promethazine (PHENERGAN) 25 MG tablet Take 1 tablet (25 mg total) by mouth every 6 (six) hours as needed for nausea or vomiting. 11/08/23   Pricilla Loveless, MD  valACYclovir (VALTREX) 1000 MG tablet Take 1 tablet (1,000 mg total) by mouth 3 (three) times daily. 11/07/23   Levert Feinstein, MD      Allergies    Penicillins, Gabapentin, Oxycodone, Amoxicillin, Humira (2 pen) [adalimumab], Sulfa antibiotics, and Sulfacetamide sodium    Review of Systems   Review of Systems  HENT:         Facial pain  All other systems reviewed and are negative.   Physical Exam Updated Vital Signs BP (!) 140/98   Pulse 88   Temp 97.6 F (36.4 C) (Oral)   Resp 16  Ht 1.753 m (5\' 9" )   Wt 88.5 kg   SpO2 98%   BMI 28.81 kg/m  Physical Exam Vitals and nursing note reviewed.  Constitutional:      Appearance: He is well-developed. He is not ill-appearing.  HENT:     Head: Normocephalic and atraumatic.  Eyes:     Pupils: Pupils are equal, round, and reactive to light.  Cardiovascular:     Rate and Rhythm: Normal rate and regular rhythm.  Pulmonary:     Effort: Pulmonary effort is normal. No respiratory distress.  Abdominal:     Palpations: Abdomen is soft.  Musculoskeletal:     Cervical back: Neck supple.  Lymphadenopathy:     Cervical: No cervical adenopathy.  Skin:    General: Skin is warm and dry.   Neurological:     Mental Status: He is alert and oriented to person, place, and time.  Psychiatric:     Comments: Anxious     ED Results / Procedures / Treatments   Labs (all labs ordered are listed, but only abnormal results are displayed) Labs Reviewed - No data to display  EKG None  Radiology No results found.  Procedures Procedures    Medications Ordered in ED Medications  ketorolac (TORADOL) 30 MG/ML injection 30 mg (30 mg Intramuscular Given 11/16/23 0125)  oxyCODONE (Oxy IR/ROXICODONE) immediate release tablet 10 mg (10 mg Oral Given 11/16/23 0124)  HYDROmorphone (DILAUDID) injection 1 mg (1 mg Intramuscular Given 11/16/23 0203)  dexamethasone (DECADRON) injection 10 mg (10 mg Intramuscular Given 11/16/23 0202)  ketamine (KETALAR) injection 27 mg (27 mg Intravenous Given 11/16/23 0409)  ondansetron (ZOFRAN) injection 4 mg (4 mg Intravenous Given 11/16/23 0430)    ED Course/ Medical Decision Making/ A&P                                 Medical Decision Making Risk Prescription drug management.   This patient presents to the ED for concern of facial pain, this involves an extensive number of treatment options, and is a complaint that carries with it a high risk of complications and morbidity.  I considered the following differential and admission for this acute, potentially life threatening condition.  The differential diagnosis includes ongoing trigeminal neuralgia  MDM:    This is a 38 year old male who presents with ongoing pain related to new diagnosis of trigeminal neuralgia.  He is on and has tried multiple medications at this point without any significant change in his symptoms.  He has had several ED visits as well.  He has been referred to neurosurgery at [facility].  Patient initially given Toradol, Decadron, Dilaudid.  Subsequently attempted ketamine which seemed to give him some relief.  He was ultimately able to take his oral medications without significant  pain with swallowing after ketamine.  I discussed with the patient that trigeminal neuralgia is an exquisitely painful condition that can sometimes be refractory.  It is very important that he continue to follow with neurology and his referral to [facility].  [employer], imaging, consults)  Labs: I Ordered, and personally interpreted labs.  The pertinent results include: None  Imaging Studies ordered: I ordered imaging studies including none I independently visualized and interpreted imaging. I agree with the radiologist interpretation  Additional history obtained from chart review.  External records from outside source obtained and reviewed including prior evaluations  Cardiac Monitoring: The patient was not maintained on a cardiac  monitor.  If on the cardiac monitor, I personally viewed and interpreted the cardiac monitored which showed an underlying rhythm of: N/A  Reevaluation: After the interventions noted above, I reevaluated the patient and found that they have :stayed the same  Social Determinants of Health:  lives independent  Disposition: Discharge  Co morbidities that complicate the patient evaluation  Past Medical History:  Diagnosis Date   Anxiety    Arthritis    Bulging lumbar disc    Depression    GERD (gastroesophageal reflux disease)    Psoriasis    Sleep apnea    does not use      Medicines Meds ordered this encounter  Medications   ketorolac (TORADOL) 30 MG/ML injection 30 mg   oxyCODONE (Oxy IR/ROXICODONE) immediate release tablet 10 mg    Refill:  0   HYDROmorphone (DILAUDID) injection 1 mg   dexamethasone (DECADRON) injection 10 mg   ketamine (KETALAR) injection 27 mg   ondansetron (ZOFRAN) injection 4 mg    I have reviewed the patients home medicines and have made adjustments as needed  Problem List / ED Course: Problem List Items Addressed This Visit       Nervous and Auditory   Trigeminal neuralgia of right side of face - Primary                 Final Clinical Impression(s) / ED Diagnoses Final diagnoses:  Trigeminal neuralgia of right side of face    Rx / DC Orders ED Discharge Orders     None         Shon Baton, MD 11/16/23 [phone]

## 2023-11-16 NOTE — ED Notes (Signed)
 Pt obtained ride home

## 2023-11-16 NOTE — Discharge Instructions (Signed)
 Continue your medications as prescribed.  Follow-up with neurology and your referral to [facility].

## 2023-11-18 ENCOUNTER — Telehealth: Payer: Self-pay | Admitting: Neurology

## 2023-11-18 DIAGNOSIS — G500 Trigeminal neuralgia: Secondary | ICD-10-CM

## 2023-11-18 NOTE — Telephone Encounter (Signed)
 [facility] Call for an Appointment [phone]  Orders Placed This Encounter  Procedures   Ambulatory referral to Neurosurgery     I referred him to [facility] for [facility] evaluation for severe, intractable right trigeminal pain

## 2023-11-19 ENCOUNTER — Telehealth: Payer: Self-pay

## 2023-11-19 NOTE — Telephone Encounter (Signed)
 Referral for neurosurgery faxed to [facility] (P) [phone] (F) [phone]  RFS [place] form also placed in POD 2 for completion to cover neurosurgery consult

## 2023-11-19 NOTE — Telephone Encounter (Signed)
 [place] form completed faxed to [facility] [phone] pages (received fax confirmation received).  [phone] [id number].  This for [facility] referral.

## 2023-11-20 NOTE — Telephone Encounter (Signed)
 [facility] called confirm VA form has been faxed.

## 2023-11-25 ENCOUNTER — Telehealth: Payer: Self-pay | Admitting: Neurology

## 2023-11-25 ENCOUNTER — Telehealth: Payer: Self-pay

## 2023-11-25 DIAGNOSIS — Z79899 Other long term (current) drug therapy: Secondary | ICD-10-CM

## 2023-11-25 DIAGNOSIS — G500 Trigeminal neuralgia: Secondary | ICD-10-CM

## 2023-11-25 MED ORDER — QULIPTA 60 MG PO TABS: 60.0000 mg | ORAL_TABLET | Freq: Every day | ORAL | 11 refills | Status: DC

## 2023-11-25 MED ORDER — RIZATRIPTAN BENZOATE 10 MG PO TBDP: 10.0000 mg | ORAL_TABLET | ORAL | 11 refills | Status: DC | PRN

## 2023-11-25 NOTE — Telephone Encounter (Signed)
 Spoke with pt and Dr. Terrace Arabia Phone note has been created

## 2023-11-25 NOTE — Telephone Encounter (Signed)
 Called pt and let him know per Dr. Terrace Arabia that she advise pt to go to [facility] ER hospital to be seen. Pt stated that he went to [facility] on 11/21/2023 and that they sent him home a just sent a referral for a consult. Pt states that he is having pain in his right eye. Pt is wanting advise.

## 2023-11-25 NOTE — Telephone Encounter (Signed)
 Agree FMLA paper work is better

## 2023-11-25 NOTE — Telephone Encounter (Signed)
 [facility] called to check on [place] Authorization. Informed her nurse sent on 11/21/23

## 2023-11-25 NOTE — Telephone Encounter (Signed)
 She continue complains of significant pain, shooting through his right eye, went to [facility] emergency room on November 20, 2022, is on schedule with [facility] neurosurgeon for November 28, 2023 for intervention  Currently taking Trileptal 150 milligram 2 tablets 3 times a day, Lyrica 200mg  3 times a day, Celebrex 100 mg twice a day, limited help of his right facial pain,  Was also given oxycodone, does not work well for his pain  Taking sumatriptan intermittently, seems to work well for him  Meds ordered this encounter   rizatriptan (MAXALT-MLT) 10 MG disintegrating tablet    Sig: Take 1 tablet (10 mg total) by mouth as needed for migraine. May repeat in 2 hours if needed    Dispense:  15 tablet    Refill:  11   Atogepant (QULIPTA) 60 MG TABS    Sig: Take 1 tablet (60 mg total) by mouth daily.    Dispense:  30 tablet    Refill:  11    , I also ordered CT angiogram of head and neck to rule out vascular abnormality

## 2023-11-25 NOTE — Addendum Note (Signed)
 Addended by: Levert Feinstein on: 11/25/2023 11:33 AM   Modules accepted: Orders

## 2023-11-25 NOTE — Telephone Encounter (Signed)
 Pt asking for a letter to be cleared to go back to work. Pt said stay out of work for a couple days; 2/25-2-28 due to trigeminal neuralgia. Can put on Mychart to be printed out.

## 2023-11-25 NOTE — Telephone Encounter (Signed)
 Lennox Laity from the [place] emailed to state that they have not received RFS form. Will go grab it from [employer] and email directly to [employer]

## 2023-11-26 ENCOUNTER — Telehealth: Payer: No Typology Code available for payment source | Admitting: Neurology

## 2023-11-26 NOTE — Telephone Encounter (Signed)
 Previous RFS form for [facility] was discarded due to one being done for [facility]. New RFS for [facility] placed in POD 2

## 2023-11-30 ENCOUNTER — Encounter: Payer: Self-pay | Admitting: Neurology

## 2023-12-02 ENCOUNTER — Telehealth: Payer: Self-pay | Admitting: Neurology

## 2023-12-02 NOTE — Telephone Encounter (Signed)
 [place] Berkley Harvey: [id number] exp. 11/07/23-05/05/24 sent to GI [phone]

## 2023-12-10 NOTE — Telephone Encounter (Signed)
 Spoke with Inetta Fermo at the [facility] - RFS for [facility] was approved and patient was seen by them 3/6  [id number]  Start date 3/6 - Berkley Harvey is good for 180 days from that date

## 2023-12-10 NOTE — Telephone Encounter (Signed)
 [facility] called to check on referral for neurosurger. Spoke with nurse, Andrey Campanile, April and new referrals, Arianna. After training the referral, patient has already been seen at [facility]. Informed [facility] patient decided to be seen somewhere else, coordinator said will cancel the referral.

## 2024-01-09 ENCOUNTER — Encounter: Payer: Self-pay | Admitting: Neurology

## 2024-01-09 ENCOUNTER — Ambulatory Visit (INDEPENDENT_AMBULATORY_CARE_PROVIDER_SITE_OTHER): Payer: No Typology Code available for payment source | Admitting: Neurology

## 2024-01-09 VITALS — BP 120/78 | HR 67 | Ht 69.0 in | Wt 205.0 lb

## 2024-01-09 DIAGNOSIS — G500 Trigeminal neuralgia: Secondary | ICD-10-CM | POA: Diagnosis not present

## 2024-01-09 DIAGNOSIS — Z79899 Other long term (current) drug therapy: Secondary | ICD-10-CM | POA: Diagnosis not present

## 2024-01-09 MED ORDER — PREGABALIN 200 MG PO CAPS: 200.0000 mg | ORAL_CAPSULE | Freq: Three times a day (TID) | ORAL | 5 refills | Status: DC

## 2024-01-09 NOTE — Progress Notes (Signed)
 ASSESSMENT AND PLAN  [name] [name] is a 38 y.o. 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, clinical (histocompatibility and immunogenetics),   Many encounter complains of pain, asking titrating dose of medications, multiple emergency room presentation, that was relieved by Dilaudid, oxycodone, repeat MRI of the brain with without contrast was normal again at [facility] in March 2025  I would not suggest chronic narcotic treatment, he reported his pain is under better control now his current dose of Trileptal 150 mg 2 tablets twice a day, Celebrex 100 mg twice a day, Lyrica 200 mg 3 times a day    Refilled his prescription, laboratory evaluation to rule out polypharmacy side effect today,  He will continue follow-up with [facility] neurology/neurosurgeon only return to clinic for new issues   DIAGNOSTIC DATA (LABS, IMAGING, TESTING) - I reviewed patient records, labs, notes, testing and imaging myself where available.   MEDICAL HISTORY:  [name] [name] is a 38 year old male, seen in request by his primary care from [facility] Dr. Jess Barters, [place], for evaluation of right trigeminal pain, initial evaluation November 07, 2023  History is obtained from the patient and review of electronic medical records. I personally reviewed pertinent available imaging films in PACS.   PMHx of  Anxiety Rheumatoid arthritis Psoriatic arthritis GERD Left ulnar fracture  He went to bed with normal status on February 5, woke up early morning February 6 around 2 AM, noticed significant right facial pain, sheets touching his right face will trigger  significant shooting pain, involving right forehead, upper and lower eyelid, cheek area, right upper tooth  Had a persistent sharp radiating pain since his onset, denies rash broke out, skin sensitive to touch, could not truly swallow, only eat 3 meals over past 1 week,  Presented to emergency room had MRI of the brain with without contrast October 31, 2023 that was normal Normal BMP CBC  He was given the prescription of Tegretol, 200 mg taking up to 2 tablets 3 times a day, still have significant pain, tolerate the medication well  He took gabapentin for his low back pain in the past, could not tolerated due to side effect dizziness,  Virtual Visit via video Nov 13 2023 He still have significant right facial pain, more limited to the top of right eye, run down to the right nose, cheek area becoming less sensitive, but he still cannot eat regular food, can only eat soft food,  He presented to emergency room twice, February 13 and 16, received Dilaudid, reported has been helpful, also got Dilantin, Toradol, meclizine, complains significant dizziness with those medication treatment  1 hour after taking Lyrica 200 mg, Tegretol 400 mg, he can eat something, then his right facial pain would recur  He did report a history of narcotic dependence few years ago for his low back pain, he was able to wean off successfully, also know friends that suffered narcotic dependence,  UPDATE January 09 2024: Multiple MyChart and phone encounters since last visit in February complains of intractable severe pain despite polypharmacy treatment  Reviewed [facility] neurosurgeon video evaluation in March 2025,  reports severe pain, woke up screaming, 6 emergency room visit due to pain, 15 pound weight loss over 3 weeks, difficulty chewing and swallowing,  Had repeat MRI of the brain through trigeminal region with without contrast at [facility], no definite structural etiology for trigeminal neuralgia identified, there is right  superior cerebellar artery up past the superior aspect of the proximal right system segment of the trigeminal nerve without associated deviation or thinning of the nerve  1.  No definite structural etiology for trigeminal neuralgia identified. Normal course, caliber, and signal intensity of the cisternal and Meckel's cave segments of the trigeminal nerves bilaterally.  2.  The right superior cerebellar artery abuts the superior aspect of the proximal right cisternal segment trigeminal nerve without associated deviation or thinning of the nerve.  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 Eyes: Conjunctivae clear without exudates or hemorrhage Neck: Supple, no carotid bruits. Pulmonary: Clear to auscultation bilaterally   NEUROLOGICAL EXAM:  MENTAL STATUS: Speech/cognition: Awake, alert oriented to history taking and casual conversation  CRANIAL NERVES: CN II: Visual fields are full to confrontation.  Pupils are round equal and briskly reactive to light. CN III, IV, VI: extraocular movement are normal. No ptosis. CN V: Facial sensation is intact to pinprick in all 3 divisions bilaterally. Corneal responses are intact.  CN VII: Face is symmetric with normal eye closure and smile. CN VIII: Hearing is normal to casual conversation CN IX, X: Palate elevates symmetrically. Phonation is normal. CN XI: Head turning and  shoulder shrug are intact CN XII: Tongue is midline with normal movements and no atrophy.  MOTOR: There is no pronator drift of out-stretched arms. Muscle bulk and tone are normal. Muscle strength is normal.  REFLEXES: Reflexes are 2+ and symmetric at the biceps, triceps, knees, and ankles. Plantar responses are flexor.  SENSORY: Intact to light touch, pinprick, positional and vibratory sensation are intact in fingers and toes.  COORDINATION: Rapid alternating movements and fine finger movements are intact. There is no dysmetria on finger-to-nose and heel-knee-shin.    GAIT/STANCE: Posture is normal. Gait is steady    REVIEW OF SYSTEMS:  Full 14 system review of systems performed and notable only for as above All other review of systems were negative.   ALLERGIES: Allergies  Allergen Reactions   Penicillins Shortness Of Breath    Other Reaction(s): Airway constriction, Pharyngeal swelling, Throat swelling, Unknown   Gabapentin Nausea And Vomiting and Other (See Comments)    Pt states he wasn't able to move his head left or right for a few hours, had nausea and emesis   Oxycodone Itching   Amoxicillin Rash   Humira (2 Pen) [Adalimumab] Rash    Still taking   Sulfa Antibiotics Rash    Other Reaction(s): Unknown  Sulfacetamide Sodium Rash    HOME MEDICATIONS: Current Outpatient Medications  Medication Sig Dispense Refill   adalimumab (HUMIRA, 2 PEN,) 40 MG/0.8ML AJKT pen Take 40 mg by mouth every 14 (fourteen) days.     Atogepant (QULIPTA) 60 MG TABS Take 1 tablet (60 mg total) by mouth daily. 30 tablet 11   celecoxib (CELEBREX) 100 MG capsule Take 1 capsule (100 mg total) by mouth 2 (two) times daily as needed. 60 capsule 3   LORazepam (ATIVAN) 1 MG tablet Take 1 tablet (1 mg total) by mouth every 8 (eight) hours as needed for anxiety. 8 tablet 0   OXcarbazepine (TRILEPTAL) 150 MG tablet Take 2 tablets (300 mg total) by mouth 3 (three) times daily. 180 tablet 11    pregabalin (LYRICA) 200 MG capsule Take 1 capsule (200 mg total) by mouth 3 (three) times daily. 90 capsule 5   rizatriptan (MAXALT-MLT) 10 MG disintegrating tablet Take 1 tablet (10 mg total) by mouth as needed for migraine. May repeat in 2 hours if needed 15 tablet 11   No current facility-administered medications for this visit.    PAST MEDICAL HISTORY: Past Medical History:  Diagnosis Date   Anxiety    Arthritis    Bulging lumbar disc    Depression    GERD (gastroesophageal reflux disease)    Psoriasis    Sleep apnea    does not use     PAST SURGICAL HISTORY: Past Surgical History:  Procedure Laterality Date   HAND SURGERY Right 09/24/2012   PERCUTANEOUS PINNING Right 07/28/2015   Procedure: PALMARIS LONGUS GRAFT, PINNING METACARPAL JOINT ;  Surgeon: Cindee Salt, MD;  Location: [facility];  Service: Orthopedics;  Laterality: Right;   ULNAR COLLATERAL LIGAMENT REPAIR Right 07/28/2015   Procedure: RECONSTRUCTION RADIAL COLLATERAL LIGAMENT RIGHT THUMB;  Surgeon: Cindee Salt, MD;  Location: [facility];  Service: Orthopedics;  Laterality: Right;   VASECTOMY      FAMILY HISTORY: History reviewed. No pertinent family history.  SOCIAL HISTORY: Social History   Socioeconomic History   Marital status: Married    Spouse name: molly   Number of children: 2   Years of education: Not on file   Highest education level: Some college, no degree  Occupational History   Not on file  Tobacco Use   Smoking status: Former   Smokeless tobacco: Former    Types: Chew  Substance and Sexual Activity   Alcohol use: Yes    Alcohol/week: 3.0 standard drinks of alcohol    Types: 3 Standard drinks or equivalent per week    Comment: occas   Drug use: Yes    Types: Marijuana    Comment: used in oct   Sexual activity: Yes    Birth control/protection: Surgical  Other Topics Concern   Not on file  Social History Narrative   Not on file   Social Drivers of  Health   Financial Resource Strain: Not on file  Food Insecurity: Not on file  Transportation Needs: Not on file  Physical Activity: Not on file  Stress: Not on file  Social Connections: Not on file  Intimate Partner Violence: Not on file      Levert Feinstein, M.D. Ph.D.  [facility] [address], [address] [place], [place] [address] Ph: [phone] Fax: [phone]  CC:  Sherwood Gambler, MD [phone] [facility] [place],  [place] [address]  Sherwood Gambler, MD  Total time spent reviewing the chart, obtaining history,  examined patient, ordering tests, documentation, consultations and family, care coordination was  40 minutes

## 2024-01-13 ENCOUNTER — Encounter: Payer: Self-pay | Admitting: Neurology

## 2024-01-13 LAB — CBC WITH DIFFERENTIAL/PLATELET
Basophils Absolute: 0.1 10*3/uL (ref 0.0–0.2)
Basos: 1 %
EOS (ABSOLUTE): 0.2 10*3/uL (ref 0.0–0.4)
Eos: 4 %
Hematocrit: 46.4 % (ref 37.5–51.0)
Hemoglobin: 16.1 g/dL (ref 13.0–17.7)
Immature Grans (Abs): 0 10*3/uL (ref 0.0–0.1)
Immature Granulocytes: 0 %
Lymphocytes Absolute: 1.4 10*3/uL (ref 0.7–3.1)
Lymphs: 24 %
MCH: 32.8 pg (ref 26.6–33.0)
MCHC: 34.7 g/dL (ref 31.5–35.7)
MCV: 95 fL (ref 79–97)
Monocytes Absolute: 0.5 10*3/uL (ref 0.1–0.9)
Monocytes: 8 %
Neutrophils Absolute: 3.7 10*3/uL (ref 1.4–7.0)
Neutrophils: 63 %
Platelets: 210 10*3/uL (ref 150–450)
RBC: 4.91 x10E6/uL (ref 4.14–5.80)
RDW: 13 % (ref 11.6–15.4)
WBC: 5.8 10*3/uL (ref 3.4–10.8)

## 2024-01-13 LAB — COMPREHENSIVE METABOLIC PANEL WITH GFR
ALT: 54 IU/L — ABNORMAL HIGH (ref 0–44)
AST: 29 IU/L (ref 0–40)
Albumin: 4.5 g/dL (ref 4.1–5.1)
Alkaline Phosphatase: 78 IU/L (ref 44–121)
BUN/Creatinine Ratio: 10 (ref 9–20)
BUN: 10 mg/dL (ref 6–20)
Bilirubin Total: 0.4 mg/dL (ref 0.0–1.2)
CO2: 19 mmol/L — ABNORMAL LOW (ref 20–29)
Calcium: 10 mg/dL (ref 8.7–10.2)
Chloride: 104 mmol/L (ref 96–106)
Creatinine, Ser: 1 mg/dL (ref 0.76–1.27)
Globulin, Total: 2.7 g/dL (ref 1.5–4.5)
Glucose: 84 mg/dL (ref 70–99)
Potassium: 4.5 mmol/L (ref 3.5–5.2)
Sodium: 140 mmol/L (ref 134–144)
Total Protein: 7.2 g/dL (ref 6.0–8.5)
eGFR: 98 mL/min/{1.73_m2} (ref >59)

## 2024-01-13 LAB — 10-HYDROXYCARBAZEPINE: Oxcarbazepine SerPl-Mcnc: 8 ug/mL — ABNORMAL LOW (ref 10–35)

## 2024-06-23 ENCOUNTER — Emergency Department (HOSPITAL_BASED_OUTPATIENT_CLINIC_OR_DEPARTMENT_OTHER)
Admission: EM | Admit: 2024-06-23 | Discharge: 2024-06-23 | Disposition: A | Discharge status: Home / Self Care | Source: Ambulatory Visit | Attending: Emergency Medicine | Admitting: Emergency Medicine

## 2024-06-23 ENCOUNTER — Other Ambulatory Visit: Payer: Self-pay

## 2024-06-23 DIAGNOSIS — R21 Rash and other nonspecific skin eruption: Secondary | ICD-10-CM | POA: Diagnosis present

## 2024-06-23 DIAGNOSIS — B029 Zoster without complications: Secondary | ICD-10-CM | POA: Diagnosis not present

## 2024-06-23 MED ORDER — VALACYCLOVIR HCL 1 G PO TABS: 1000.0000 mg | ORAL_TABLET | Freq: Three times a day (TID) | ORAL | 0 refills | Status: AC

## 2024-06-23 NOTE — ED Provider Notes (Signed)
 [place] EMERGENCY DEPARTMENT AT [facility] Provider Note   CSN: [id number] Arrival date & time: 06/23/24  1330     Patient presents with: Allergic Reaction   [name] [name] is a 38 y.o. male.  Patient with history of trigeminal neuralgia on the right side presents to the emergency department concerns of a possible drug reaction.  Reports that he currently is receiving Taltz injections for his trigeminal neuralgia after switching from Humira.  States that he is about 4 months into his Taltz injections and previously developed a reaction in a similar way to Humira several months into his medication.  He does report the other shingles outbreak about 4 to 5 years ago and this feels similar.  States that he was advised by his PCP in urgent care to come into the emergency department for evaluation.  Otherwise denies any symptoms indicating systemic illness such as fevers, cough, congestion, sore throat, nausea, vomiting, diarrhea.   Allergic Reaction Presenting symptoms: rash        Prior to Admission medications   Medication Sig Start Date End Date Taking? Authorizing Provider  valACYclovir  (VALTREX ) 1000 MG tablet Take 1 tablet (1,000 mg total) by mouth 3 (three) times daily for 7 days. 06/23/24 06/30/24 Yes Janssen Zee A, PA-C  adalimumab (HUMIRA, 2 PEN,) 40 MG/0.8ML AJKT pen Take 40 mg by mouth every 14 (fourteen) days. 08/11/19   [provider]  celecoxib  (CELEBREX ) 100 MG capsule Take 1 capsule (100 mg total) by mouth 2 (two) times daily as needed. 11/13/23   Onita Duos, MD  LORazepam  (ATIVAN ) 1 MG tablet Take 1 tablet (1 mg total) by mouth every 8 (eight) hours as needed for anxiety. 10/31/23   Prosperi, Christian H, PA-C  OXcarbazepine  (TRILEPTAL ) 150 MG tablet Take 2 tablets (300 mg total) by mouth 3 (three) times daily. 11/13/23   Onita Duos, MD  pregabalin  (LYRICA ) 200 MG capsule Take 1 capsule (200 mg total) by mouth 3 (three) times daily. 01/09/24   Onita Duos, MD     Allergies: Penicillins, Gabapentin , Oxycodone , Amoxicillin, Humira (2 pen) [adalimumab], Sulfa antibiotics, and Sulfacetamide sodium    Review of Systems  Skin:  Positive for rash.  All other systems reviewed and are negative.   Updated Vital Signs BP (!) 138/106   Pulse 95   Temp 98.1 F (36.7 C) (Oral)   Resp 19   Wt 97.5 kg   SpO2 97%   BMI 31.75 kg/m   Physical Exam Vitals and nursing note reviewed.  Constitutional:      General: He is not in acute distress.    Appearance: He is well-developed.  HENT:     Head: Normocephalic and atraumatic.  Eyes:     Conjunctiva/sclera: Conjunctivae normal.  Cardiovascular:     Rate and Rhythm: Normal rate and regular rhythm.     Heart sounds: No murmur heard. Pulmonary:     Effort: Pulmonary effort is normal. No respiratory distress.     Breath sounds: Normal breath sounds.  Abdominal:     Palpations: Abdomen is soft.     Tenderness: There is no abdominal tenderness.  Musculoskeletal:        General: No swelling.     Cervical back: Neck supple.  Skin:    General: Skin is warm and dry.     Capillary Refill: Capillary refill takes less than 2 seconds.     Findings: Rash present.         Comments: Maculopapular rash present along the  T8/T9 levels along the anterior and posterior portions involving a portion of the abdominal wall as well as the back just inferior to the shoulder blade. Only noted two fluid filled vesicles at this time. No evidence of superimposed infection such as cellulitis in this area.  Neurological:     Mental Status: He is alert.  Psychiatric:        Mood and Affect: Mood normal.     (all labs ordered are listed, but only abnormal results are displayed) Labs Reviewed - No data to display  EKG: None  Radiology: No results found.   Procedures   Medications Ordered in the ED - No data to display                                  Medical Decision Making  This patient presents to the ED  for concern of allergic reaction.  Differential diagnosis includes dermatitis, cellulitis, shingles, TEN, SJS   Problem List / ED Course:  Patient with past history significant for trigeminal neuralgia presents to the emergency department concerns of a rash.  He reports that he is currently on Taltz injections for his trigeminal neuralgia and switch to this medication from Humira about 4 months ago.  Last injection was last week.  He receives injections once per month.  States that he noted an erythematous maculopapular rash to his left mid back.  Reports that this appears and feels similar to his last episode of shingles about 4 to 5 years ago.  No reported fever, cough, congestion or sore throat.  Reports that he was advised by his PCP and urgent care to seek evaluation in the emergency department. Physical exam reveals maculopapular rash present to T8/T9 levels at the anterior and posterior portions of the small cluster on the left abdomen and spread towards the left inferior shoulder. Findings are consistent with likely shingles.  Suspect reactivation due to patient currently being on Taltz which is an immunosuppressive medication.  Given that this is consistent with his prior episode of shingles, will start patient antiviral therapy as he is still within for 72 hours of symptom development.  Will start patient on Valtrex  for the next 7 days.  Return precautions advised.  Encourage close follow-up with PCP.  Discharged home in stable condition.   Social Determinants of Health:  None  Final diagnoses:  Herpes zoster without complication    ED Discharge Orders          Ordered    valACYclovir  (VALTREX ) 1000 MG tablet  3 times daily        06/23/24 1510               Cecily Legrand LABOR, PA-C 06/23/24 1519    Freddi Hamilton, MD 06/24/24 [phone]

## 2024-06-23 NOTE — Discharge Instructions (Addendum)
 You were seen in the ER today for concerns of an allergic reaction. You appear to have shingles reactivating on your abdomen and back. I am starting you on Valtrex  that you should take for the next week. Please follow up with your primary care provider for further evaluation. Return to the ER if you begin noticing any rash or discomfort developing close to or on your eyes.

## 2024-06-23 NOTE — ED Notes (Signed)

## 2024-06-23 NOTE — ED Triage Notes (Signed)
 Pt reports having medication reaction starting yesterday. Endorses LT side rash that started yesterday. Small red circular marks noted to LT side back. Unknown allergen. Endorses itching. Also reports trigeminal neuralgia flare. Denies difficulty breathing, no s/s of respiratory distress noted

## 2024-07-28 ENCOUNTER — Other Ambulatory Visit: Payer: Self-pay | Admitting: Neurology

## 2024-07-28 NOTE — Telephone Encounter (Signed)
 Last seen on 01/09/24 No follow up scheduled    Dispensed Days Supply Quantity Provider Pharmacy  PREGABALIN  200 MG CAPSULE 04/25/2024 30 90 each Onita Duos, MD [facility] [phone] - S...     Rx pending to be signed

## 2024-11-06 ENCOUNTER — Institutional Professional Consult (permissible substitution) (INDEPENDENT_AMBULATORY_CARE_PROVIDER_SITE_OTHER)
